# Patient Record
Sex: Female | Born: 1940 | Race: White | Hispanic: No | Smoking: Never smoker
Health system: Southern US, Community
[De-identification: ages and names within clinical notes are randomized; demographics above are authoritative.]

## PROBLEM LIST (undated history)

## (undated) DIAGNOSIS — I749 Embolism and thrombosis of unspecified artery: Secondary | ICD-10-CM

## (undated) DIAGNOSIS — I10 Essential (primary) hypertension: Secondary | ICD-10-CM

## (undated) DIAGNOSIS — R230 Cyanosis: Secondary | ICD-10-CM

## (undated) DIAGNOSIS — M79675 Pain in left toe(s): Secondary | ICD-10-CM

## (undated) HISTORY — DX: Pain in left toe(s): M79.675

## (undated) HISTORY — DX: Embolism and thrombosis of unspecified artery: I74.9

## (undated) HISTORY — DX: Essential (primary) hypertension: I10

## (undated) HISTORY — DX: Cyanosis: R23.0

## (undated) SURGERY — Surgical Case
Anesthesia: *Unknown

---

## 1998-03-02 ENCOUNTER — Other Ambulatory Visit: Admission: RE | Admit: 1998-03-02 | Discharge: 1998-03-02 | Payer: Self-pay | Admitting: Family Medicine

## 1998-07-03 ENCOUNTER — Encounter: Payer: Self-pay | Admitting: Urology

## 1998-07-07 ENCOUNTER — Ambulatory Visit (HOSPITAL_COMMUNITY): Admission: RE | Admit: 1998-07-07 | Discharge: 1998-07-07 | Payer: Self-pay | Admitting: Urology

## 1999-04-19 ENCOUNTER — Other Ambulatory Visit: Admission: RE | Admit: 1999-04-19 | Discharge: 1999-04-19 | Payer: Self-pay | Admitting: Family Medicine

## 2019-12-27 DIAGNOSIS — I10 Essential (primary) hypertension: Secondary | ICD-10-CM | POA: Diagnosis not present

## 2020-01-01 DIAGNOSIS — Z299 Encounter for prophylactic measures, unspecified: Secondary | ICD-10-CM | POA: Diagnosis not present

## 2020-01-01 DIAGNOSIS — I10 Essential (primary) hypertension: Secondary | ICD-10-CM | POA: Diagnosis not present

## 2020-01-01 DIAGNOSIS — F322 Major depressive disorder, single episode, severe without psychotic features: Secondary | ICD-10-CM | POA: Diagnosis not present

## 2020-01-26 DIAGNOSIS — I10 Essential (primary) hypertension: Secondary | ICD-10-CM | POA: Diagnosis not present

## 2020-02-26 DIAGNOSIS — I10 Essential (primary) hypertension: Secondary | ICD-10-CM | POA: Diagnosis not present

## 2020-03-27 DIAGNOSIS — I10 Essential (primary) hypertension: Secondary | ICD-10-CM | POA: Diagnosis not present

## 2020-04-28 DIAGNOSIS — I10 Essential (primary) hypertension: Secondary | ICD-10-CM | POA: Diagnosis not present

## 2020-05-28 DIAGNOSIS — I10 Essential (primary) hypertension: Secondary | ICD-10-CM | POA: Diagnosis not present

## 2020-06-27 DIAGNOSIS — I10 Essential (primary) hypertension: Secondary | ICD-10-CM | POA: Diagnosis not present

## 2020-07-28 DIAGNOSIS — I10 Essential (primary) hypertension: Secondary | ICD-10-CM | POA: Diagnosis not present

## 2020-08-27 DIAGNOSIS — I10 Essential (primary) hypertension: Secondary | ICD-10-CM | POA: Diagnosis not present

## 2020-09-28 DIAGNOSIS — I10 Essential (primary) hypertension: Secondary | ICD-10-CM | POA: Diagnosis not present

## 2020-10-26 DIAGNOSIS — I10 Essential (primary) hypertension: Secondary | ICD-10-CM | POA: Diagnosis not present

## 2020-11-17 DIAGNOSIS — B351 Tinea unguium: Secondary | ICD-10-CM | POA: Diagnosis not present

## 2020-11-26 DIAGNOSIS — I10 Essential (primary) hypertension: Secondary | ICD-10-CM | POA: Diagnosis not present

## 2020-12-25 DIAGNOSIS — I10 Essential (primary) hypertension: Secondary | ICD-10-CM | POA: Diagnosis not present

## 2021-01-05 ENCOUNTER — Other Ambulatory Visit: Payer: Self-pay

## 2021-01-05 DIAGNOSIS — M79675 Pain in left toe(s): Secondary | ICD-10-CM | POA: Diagnosis not present

## 2021-01-05 DIAGNOSIS — I739 Peripheral vascular disease, unspecified: Secondary | ICD-10-CM

## 2021-01-05 DIAGNOSIS — I749 Embolism and thrombosis of unspecified artery: Secondary | ICD-10-CM | POA: Diagnosis not present

## 2021-01-11 ENCOUNTER — Ambulatory Visit (INDEPENDENT_AMBULATORY_CARE_PROVIDER_SITE_OTHER): Payer: Medicare PPO | Admitting: Surgery

## 2021-01-11 ENCOUNTER — Other Ambulatory Visit: Payer: Self-pay

## 2021-01-11 ENCOUNTER — Ambulatory Visit (HOSPITAL_COMMUNITY)
Admission: RE | Admit: 2021-01-11 | Discharge: 2021-01-11 | Disposition: A | Discharge status: Home / Self Care | Payer: Medicare PPO | Source: Ambulatory Visit | Attending: Surgery | Admitting: Surgery

## 2021-01-11 ENCOUNTER — Encounter: Payer: Self-pay | Admitting: *Deleted

## 2021-01-11 ENCOUNTER — Encounter: Payer: Medicare PPO | Admitting: Surgery

## 2021-01-11 ENCOUNTER — Other Ambulatory Visit: Payer: Self-pay | Admitting: *Deleted

## 2021-01-11 ENCOUNTER — Encounter: Payer: Self-pay | Admitting: Surgery

## 2021-01-11 ENCOUNTER — Encounter (HOSPITAL_COMMUNITY): Payer: Medicare PPO

## 2021-01-11 VITALS — BP 184/83 | HR 54 | Temp 97.9°F | Resp 20 | Ht 65.0 in | Wt 160.0 lb

## 2021-01-11 DIAGNOSIS — I75022 Atheroembolism of left lower extremity: Secondary | ICD-10-CM | POA: Diagnosis not present

## 2021-01-11 DIAGNOSIS — I739 Peripheral vascular disease, unspecified: Secondary | ICD-10-CM | POA: Insufficient documentation

## 2021-01-11 NOTE — H&P (View-Only) (Signed)
Vascular and Vein Specialist of [facility]  Patient name: Dawn Spence MRN: [id number] DOB: 06-Sep-1940 Sex: female   REQUESTING PROVIDER:    Adam Phenix    REASON FOR CONSULT:    Blue toe  HISTORY OF PRESENT ILLNESS:   Dawn Spence is a 80 y.o. female, who is referred for evaluation of a left blue toe.  The patient states that this is been going on for approximately 6 months.  She has lost most of the nail to that toe.  Prior to this, she was able to walk around the block and up hills without difficulty however now she has to stop because of cramping.  She does not have any open wounds.  She is starting to wake up in the middle the night with toe pain.  There is some discoloration on the toes to the right foot but it is not as bad.  The patient was recently started on aspirin.  She is not on cholesterol medication.  She is medically managed for hypertension.  She is a non-smoker.  She has a family history of peripheral vascular disease with her father having had bilateral amputations.  PAST MEDICAL HISTORY    Past Medical History:  Diagnosis Date  . Arterial embolism (HCC)   . Blue toes   . Great toe pain, left    Pain wakes her up at night.  . Hypertension   . Microembolization (HCC)      FAMILY HISTORY   History reviewed. No pertinent family history.  SOCIAL HISTORY:   Social History   Socioeconomic History  . Marital status: Widowed    Spouse name: Not on file  . Number of children: Not on file  . Years of education: Not on file  . Highest education level: Not on file  Occupational History  . Not on file  Tobacco Use  . Smoking status: Never Smoker  . Smokeless tobacco: Never Used  Vaping Use  . Vaping Use: Never used  Substance and Sexual Activity  . Alcohol use: Not Currently  . Drug use: Never  . Sexual activity: Not on file  Other Topics Concern  . Not on file  Social History Narrative  . Not on file   Social  Determinants of Health   Financial Resource Strain: Not on file  Food Insecurity: Not on file  Transportation Needs: Not on file  Physical Activity: Not on file  Stress: Not on file  Social Connections: Not on file  Intimate Partner Violence: Not on file    ALLERGIES:    Allergies  Allergen Reactions  . Sulfa Antibiotics     CURRENT MEDICATIONS:    Current Outpatient Medications  Medication Sig Dispense Refill  . aspirin 81 MG chewable tablet Chew by mouth daily.    . ciclopirox (PENLAC) 8 % solution Apply topically daily. Apply to Big toenails once daily, once weekly cleanse nails thoroughly with nail polish remover.    Marland Kitchen losartan (COZAAR) 25 MG tablet Take 25 mg by mouth daily.    . metoprolol succinate (TOPROL-XL) 100 MG 24 hr tablet Take 100 mg by mouth daily. Take with or immediately following a meal.    . VENLAFAXINE HCL PO Take by mouth.    Marland Kitchen HYDROcodone-acetaminophen (NORCO/VICODIN) 5-325 MG tablet Take 1 tablet by mouth every 6 (six) hours as needed for moderate pain. (Patient not taking: Reported on 01/11/2021)     No current facility-administered medications for this visit.    REVIEW OF SYSTEMS:   [  X] denotes positive finding, [ ]  denotes negative finding Cardiac  Comments:  Chest pain or chest pressure:    Shortness of breath upon exertion:    Short of breath when lying flat:    Irregular heart rhythm:        Vascular    Pain in calf, thigh, or hip brought on by ambulation: x   Pain in feet at night that wakes you up from your sleep:  x   Blood clot in your veins:    Leg swelling:         Pulmonary    Oxygen at home:    Productive cough:     Wheezing:         Neurologic    Sudden weakness in arms or legs:     Sudden numbness in arms or legs:     Sudden onset of difficulty speaking or slurred speech:    Temporary loss of vision in one eye:     Problems with dizziness:         Gastrointestinal    Blood in stool:      Vomited blood:          Genitourinary    Burning when urinating:     Blood in urine:        Psychiatric    Major depression:         Hematologic    Bleeding problems:    Problems with blood clotting too easily:        Skin    Rashes or ulcers:        Constitutional    Fever or chills:     PHYSICAL EXAM:   Vitals:   01/11/21 1345  BP: (!) 184/83  Pulse: (!) 54  Resp: 20  Temp: 97.9 F (36.6 C)  SpO2: 96%  Weight: 160 lb (72.6 kg)  Height: 5\' 5"  (1.651 m)    GENERAL: The patient is a well-nourished female, in no acute distress. The vital signs are documented above. CARDIAC: There is a regular rate and rhythm.  VASCULAR: Nonpalpable pedal pulses PULMONARY: Nonlabored respirations MUSCULOSKELETAL: There are no major deformities or cyanosis. NEUROLOGIC: No focal weakness or paresthesias are detected. SKIN: Blue discoloration of left great toe PSYCHIATRIC: The patient has a normal affect.  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  Rather than undergoing full embolic work-up, I think we need to focus on her lower extremity vasculature.  I will start with aortogram and bilateral runoff via a right femoral approach.  If she has lesions amenable to percutaneous revascularization I will proceed at that time.  If not, we will consider surgical revascularization at a later date.  The patient  will need to be started on a statin following the procedure.  We discussed that even with revascularization, she may require to amputation for pain control.  I have for this scheduled for Tuesday, May 24.   , IV, MD,  FACS [facility]ists of [facility] [phone] Pager [phone]

## 2021-01-11 NOTE — Progress Notes (Signed)
 Vascular and Vein Specialist of [place]  Patient name: Dawn Spence MRN: [id number] DOB: 07/02/1941 Sex: female   REQUESTING PROVIDER:    Cody Drake    REASON FOR CONSULT:    Blue toe  HISTORY OF PRESENT ILLNESS:   Dawn Spence is a 80 y.o. female, who is referred for evaluation of a left blue toe.  The patient states that this is been going on for approximately 6 months.  She has lost most of the nail to that toe.  Prior to this, she was able to walk around the block and up hills without difficulty however now she has to stop because of cramping.  She does not have any open wounds.  She is starting to wake up in the middle the night with toe pain.  There is some discoloration on the toes to the right foot but it is not as bad.  The patient was recently started on aspirin.  She is not on cholesterol medication.  She is medically managed for hypertension.  She is a non-smoker.  She has a family history of peripheral vascular disease with her father having had bilateral amputations.  PAST MEDICAL HISTORY    Past Medical History:  Diagnosis Date  . Arterial embolism (HCC)   . Blue toes   . Great toe pain, left    Pain wakes her up at night.  . Hypertension   . Microembolization (HCC)      FAMILY HISTORY   History reviewed. No pertinent family history.  SOCIAL HISTORY:   Social History   Socioeconomic History  . Marital status: Widowed    Spouse name: Not on file  . Number of children: Not on file  . Years of education: Not on file  . Highest education level: Not on file  Occupational History  . Not on file  Tobacco Use  . Smoking status: Never Smoker  . Smokeless tobacco: Never Used  Vaping Use  . Vaping Use: Never used  Substance and Sexual Activity  . Alcohol use: Not Currently  . Drug use: Never  . Sexual activity: Not on file  Other Topics Concern  . Not on file  Social History Narrative  . Not on file   Social  Determinants of Health   Financial Resource Strain: Not on file  Food Insecurity: Not on file  Transportation Needs: Not on file  Physical Activity: Not on file  Stress: Not on file  Social Connections: Not on file  Intimate Partner Violence: Not on file    ALLERGIES:    Allergies  Allergen Reactions  . Sulfa Antibiotics     CURRENT MEDICATIONS:    Current Outpatient Medications  Medication Sig Dispense Refill  . aspirin 81 MG chewable tablet Chew by mouth daily.    . ciclopirox (PENLAC) 8 % solution Apply topically daily. Apply to Big toenails once daily, once weekly cleanse nails thoroughly with nail polish remover.    . losartan (COZAAR) 25 MG tablet Take 25 mg by mouth daily.    . metoprolol succinate (TOPROL-XL) 100 MG 24 hr tablet Take 100 mg by mouth daily. Take with or immediately following a meal.    . VENLAFAXINE HCL PO Take by mouth.    . HYDROcodone-acetaminophen (NORCO/VICODIN) 5-325 MG tablet Take 1 tablet by mouth every 6 (six) hours as needed for moderate pain. (Patient not taking: Reported on 01/11/2021)     No current facility-administered medications for this visit.    REVIEW OF SYSTEMS:   [  X] denotes positive finding, [ ]  denotes negative finding Cardiac  Comments:  Chest pain or chest pressure:    Shortness of breath upon exertion:    Short of breath when lying flat:    Irregular heart rhythm:        Vascular    Pain in calf, thigh, or hip brought on by ambulation: x   Pain in feet at night that wakes you up from your sleep:  x   Blood clot in your veins:    Leg swelling:         Pulmonary    Oxygen at home:    Productive cough:     Wheezing:         Neurologic    Sudden weakness in arms or legs:     Sudden numbness in arms or legs:     Sudden onset of difficulty speaking or slurred speech:    Temporary loss of vision in one eye:     Problems with dizziness:         Gastrointestinal    Blood in stool:      Vomited blood:          Genitourinary    Burning when urinating:     Blood in urine:        Psychiatric    Major depression:         Hematologic    Bleeding problems:    Problems with blood clotting too easily:        Skin    Rashes or ulcers:        Constitutional    Fever or chills:     PHYSICAL EXAM:   Vitals:   01/11/21 1345  BP: (!) 184/83  Pulse: (!) 54  Resp: 20  Temp: 97.9 F (36.6 C)  SpO2: 96%  Weight: 160 lb (72.6 kg)  Height: 5\' 5"  (1.651 m)    GENERAL: The patient is a well-nourished female, in no acute distress. The vital signs are documented above. CARDIAC: There is a regular rate and rhythm.  VASCULAR: Nonpalpable pedal pulses PULMONARY: Nonlabored respirations MUSCULOSKELETAL: There are no major deformities or cyanosis. NEUROLOGIC: No focal weakness or paresthesias are detected. SKIN: Blue discoloration of left great toe PSYCHIATRIC: The patient has a normal affect.  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  Rather than undergoing full embolic work-up, I think we need to focus on her lower extremity vasculature.  I will start with aortogram and bilateral runoff via a right femoral approach.  If she has lesions amenable to percutaneous revascularization I will proceed at that time.  If not, we will consider surgical revascularization at a later date.  The patient  will need to be started on a statin following the procedure.  We discussed that even with revascularization, she may require to amputation for pain control.  I have for this scheduled for Tuesday, May 24.   , IV, MD,  FACS [facility] of [facility] [phone] Pager [phone]

## 2021-01-12 ENCOUNTER — Telehealth: Payer: Self-pay

## 2021-01-12 NOTE — Telephone Encounter (Signed)
Spoke to pt's daughter in law regarding statin not being ordered yet. Per MD note from yesterday office visit, pt is to start statin after her procedure next week. She verbalized understanding and will call back if they have any further questions/concerns.

## 2021-01-19 ENCOUNTER — Other Ambulatory Visit: Payer: Self-pay

## 2021-01-19 ENCOUNTER — Ambulatory Visit (HOSPITAL_COMMUNITY)
Admission: RE | Admit: 2021-01-19 | Discharge: 2021-01-19 | Disposition: A | Discharge status: Home / Self Care | Payer: Medicare PPO | Attending: Surgery | Admitting: Surgery

## 2021-01-19 ENCOUNTER — Encounter (HOSPITAL_COMMUNITY)
Admission: RE | Disposition: A | Discharge status: Home / Self Care | Payer: Self-pay | Source: Home / Self Care | Attending: Surgery

## 2021-01-19 DIAGNOSIS — Z7982 Long term (current) use of aspirin: Secondary | ICD-10-CM | POA: Diagnosis not present

## 2021-01-19 DIAGNOSIS — Z79899 Other long term (current) drug therapy: Secondary | ICD-10-CM | POA: Diagnosis not present

## 2021-01-19 DIAGNOSIS — I75022 Atheroembolism of left lower extremity: Secondary | ICD-10-CM | POA: Diagnosis not present

## 2021-01-19 DIAGNOSIS — Z8249 Family history of ischemic heart disease and other diseases of the circulatory system: Secondary | ICD-10-CM | POA: Diagnosis not present

## 2021-01-19 DIAGNOSIS — Z882 Allergy status to sulfonamides status: Secondary | ICD-10-CM | POA: Diagnosis not present

## 2021-01-19 DIAGNOSIS — I739 Peripheral vascular disease, unspecified: Secondary | ICD-10-CM | POA: Diagnosis not present

## 2021-01-19 DIAGNOSIS — I10 Essential (primary) hypertension: Secondary | ICD-10-CM | POA: Insufficient documentation

## 2021-01-19 HISTORY — PX: ABDOMINAL AORTOGRAM W/LOWER EXTREMITY: CATH118223

## 2021-01-19 LAB — POCT I-STAT, CHEM 8
BUN: 19 mg/dL (ref 8–23)
Calcium, Ion: 1.22 mmol/L (ref 1.15–1.40)
Chloride: 100 mmol/L (ref 98–111)
Creatinine, Ser: 1.3 mg/dL — ABNORMAL HIGH (ref 0.44–1.00)
Glucose, Bld: 111 mg/dL — ABNORMAL HIGH (ref 70–99)
HCT: 40 % (ref 36.0–46.0)
Hemoglobin: 13.6 g/dL (ref 12.0–15.0)
Potassium: 3.5 mmol/L (ref 3.5–5.1)
Sodium: 139 mmol/L (ref 135–145)
TCO2: 28 mmol/L (ref 22–32)

## 2021-01-19 SURGERY — ABDOMINAL AORTOGRAM W/LOWER EXTREMITY
Anesthesia: LOCAL | Laterality: Bilateral

## 2021-01-19 MED ORDER — HEPARIN (PORCINE) IN NACL 1000-0.9 UT/500ML-% IV SOLN
INTRAVENOUS | Status: DC | PRN
  Administered 2021-01-19: 500 mL

## 2021-01-19 MED ORDER — SODIUM CHLORIDE 0.9 % IV SOLN: INTRAVENOUS | Status: DC

## 2021-01-19 MED ORDER — FENTANYL CITRATE (PF) 100 MCG/2ML IJ SOLN
INTRAMUSCULAR | Status: AC
  Filled 2021-01-19: qty 2

## 2021-01-19 MED ORDER — LIDOCAINE HCL (PF) 1 % IJ SOLN
INTRAMUSCULAR | Status: AC
  Filled 2021-01-19: qty 30

## 2021-01-19 MED ORDER — SODIUM CHLORIDE 0.9 % WEIGHT BASED INFUSION: 1.0000 mL/kg/h | INTRAVENOUS | Status: DC

## 2021-01-19 MED ORDER — HYDRALAZINE HCL 20 MG/ML IJ SOLN: 5.0000 mg | INTRAMUSCULAR | Status: DC | PRN

## 2021-01-19 MED ORDER — MIDAZOLAM HCL 2 MG/2ML IJ SOLN
INTRAMUSCULAR | Status: AC
  Filled 2021-01-19: qty 2

## 2021-01-19 MED ORDER — SODIUM CHLORIDE 0.9% FLUSH: 3.0000 mL | INTRAVENOUS | Status: DC | PRN

## 2021-01-19 MED ORDER — IODIXANOL 320 MG/ML IV SOLN
INTRAVENOUS | Status: DC | PRN
  Administered 2021-01-19: 112 mL

## 2021-01-19 MED ORDER — ONDANSETRON HCL 4 MG/2ML IJ SOLN: 4.0000 mg | Freq: Four times a day (QID) | INTRAMUSCULAR | Status: DC | PRN

## 2021-01-19 MED ORDER — ACETAMINOPHEN 325 MG PO TABS: 650.0000 mg | ORAL_TABLET | ORAL | Status: DC | PRN

## 2021-01-19 MED ORDER — SODIUM CHLORIDE 0.9% FLUSH: 3.0000 mL | Freq: Two times a day (BID) | INTRAVENOUS | Status: DC

## 2021-01-19 MED ORDER — MIDAZOLAM HCL 2 MG/2ML IJ SOLN
INTRAMUSCULAR | Status: DC | PRN
  Administered 2021-01-19: 1 mg via INTRAVENOUS
  Administered 2021-01-19: 2 mg via INTRAVENOUS

## 2021-01-19 MED ORDER — ASPIRIN EC 81 MG PO TBEC: 81.0000 mg | DELAYED_RELEASE_TABLET | Freq: Every day | ORAL | Status: DC

## 2021-01-19 MED ORDER — SODIUM CHLORIDE 0.9 % IV SOLN: 250.0000 mL | INTRAVENOUS | Status: DC | PRN

## 2021-01-19 MED ORDER — LABETALOL HCL 5 MG/ML IV SOLN
10.0000 mg | INTRAVENOUS | Status: DC | PRN
  Administered 2021-01-19: 10 mg via INTRAVENOUS
  Filled 2021-01-19: qty 4

## 2021-01-19 MED ORDER — FENTANYL CITRATE (PF) 100 MCG/2ML IJ SOLN
INTRAMUSCULAR | Status: DC | PRN
  Administered 2021-01-19: 25 ug via INTRAVENOUS
  Administered 2021-01-19: 50 ug via INTRAVENOUS

## 2021-01-19 MED ORDER — HEPARIN (PORCINE) IN NACL 1000-0.9 UT/500ML-% IV SOLN
INTRAVENOUS | Status: AC
  Filled 2021-01-19: qty 1000

## 2021-01-19 MED ORDER — LIDOCAINE HCL (PF) 1 % IJ SOLN
INTRAMUSCULAR | Status: DC | PRN
  Administered 2021-01-19: 15 mL

## 2021-01-19 SURGICAL SUPPLY — 10 items
CATH OMNI FLUSH 5F 65CM (CATHETERS) ×1 IMPLANT
DEVICE VASC CLSR CELT ART 5 (Vascular Products) ×1 IMPLANT
GUIDEWIRE ANGLED .035X150CM (WIRE) ×1 IMPLANT
KIT MICROPUNCTURE NIT STIFF (SHEATH) ×1 IMPLANT
KIT PV (KITS) ×2 IMPLANT
SHEATH PINNACLE 5F 10CM (SHEATH) ×1 IMPLANT
SYR MEDRAD MARK V 150ML (SYRINGE) ×1 IMPLANT
TRANSDUCER W/STOPCOCK (MISCELLANEOUS) ×2 IMPLANT
TRAY PV CATH (CUSTOM PROCEDURE TRAY) ×2 IMPLANT
WIRE HITORQ VERSACORE ST 145CM (WIRE) ×1 IMPLANT

## 2021-01-19 NOTE — Discharge Instructions (Signed)
Femoral Site Care  This sheet gives you information about how to care for yourself after your procedure. Your health care provider may also give you more specific instructions. If you have problems or questions, contact your health care provider. What can I expect after the procedure? After the procedure, it is common to have:  Bruising that usually fades within 1-2 weeks.  Tenderness at the site. Follow these instructions at home: Wound care  Follow instructions from your health care provider about how to take care of your insertion site. Make sure you: ? Wash your hands with soap and water before you change your bandage (dressing). If soap and water are not available, use hand sanitizer. ? Change your dressing as told by your health care provider. ? Leave stitches (sutures), skin glue, or adhesive strips in place. These skin closures may need to stay in place for 2 weeks or longer. If adhesive strip edges start to loosen and curl up, you may trim the loose edges. Do not remove adhesive strips completely unless your health care provider tells you to do that.  Do not take baths, swim, or use a hot tub until your health care provider approves.  You may shower 24-48 hours after the procedure or as told by your health care provider. ? Gently wash the site with plain soap and water. ? Pat the area dry with a clean towel. ? Do not rub the site. This may cause bleeding.  Do not apply powder or lotion to the site. Keep the site clean and dry.  Check your femoral site every day for signs of infection. Check for: ? Redness, swelling, or pain. ? Fluid or blood. ? Warmth. ? Pus or a bad smell. Activity  For the first 2-3 days after your procedure, or as long as directed: ? Avoid climbing stairs as much as possible. ? Do not squat.  Do not lift anything that is heavier than 10 lb (4.5 kg), or the limit that you are told, until your health care provider says that it is safe.  Rest as  directed. ? Avoid sitting for a long time without moving. Get up to take short walks every 1-2 hours.  Do not drive for 24 hours if you were given a medicine to help you relax (sedative). General instructions  Take over-the-counter and prescription medicines only as told by your health care provider.  Keep all follow-up visits as told by your health care provider. This is important. Contact a health care provider if you have:  A fever or chills.  You have redness, swelling, or pain around your insertion site. Get help right away if:  The catheter insertion area swells very fast.  You pass out.  You suddenly start to sweat or your skin gets clammy.  The catheter insertion area is bleeding, and the bleeding does not stop when you hold steady pressure on the area.  The area near or just beyond the catheter insertion site becomes pale, cool, tingly, or numb. These symptoms may represent a serious problem that is an emergency. Do not wait to see if the symptoms will go away. Get medical help right away. Call your local emergency services (911 in the [place] Do not drive yourself to the hospital. Summary  After the procedure, it is common to have bruising that usually fades within 1-2 weeks.  Check your femoral site every day for signs of infection.  Do not lift anything that is heavier than 10 lb (4.5 kg), or   the limit that you are told, until your health care provider says that it is safe. This information is not intended to replace advice given to you by your health care provider. Make sure you discuss any questions you have with your health care provider. Document Revised: 04/17/2020 Document Reviewed: 04/17/2020 Elsevier Patient Education  2021 Elsevier Inc[employer]  

## 2021-01-19 NOTE — Interval H&P Note (Signed)
History and Physical Interval Note:  01/19/2021 2:03 PM  Dawn Spence  has presented today for surgery, with the diagnosis of pad.  The various methods of treatment have been discussed with the patient and family. After consideration of risks, benefits and other options for treatment, the patient has consented to  Procedure(s): ABDOMINAL AORTOGRAM W/LOWER EXTREMITY (N/A) as a surgical intervention.  The patient's history has been reviewed, patient examined, no change in status, stable for surgery.  I have reviewed the patient's chart and labs.  Questions were answered to the patient's satisfaction.     [name]

## 2021-01-19 NOTE — Op Note (Signed)
    Patient name: Dawn Spence MRN: [id number] DOB: 1941-04-25 Sex: female  01/19/2021 Pre-operative Diagnosis: Left blue toe Post-operative diagnosis:  Same Surgeon:  [name] Procedure Performed:  1.  Ultrasound-guided access, right femoral artery  2.  Abdominal aortogram  3.  Bilateral lower extremity runoff  4.  Second-order catheterization  5.  Conscious sedation, 39 minutes  6.  Closure device, Celt     Indications: This is a 80 year old female with a left gluteal.  She comes in for further evaluation and possible intervention.  Procedure:  The patient was identified in the holding area and taken to room 8.  The patient was then placed supine on the table and prepped and draped in the usual sterile fashion.  A time out was called.  Conscious sedation was administered with the use of IV fentanyl and Versed under continuous physician and nurse monitoring.  Heart rate, blood pressure, and oxygen saturation were continuously monitored.  Total sedation time was 39 minutes.  Ultrasound was used to evaluate the right common femoral artery.  It was patent .  A digital ultrasound image was acquired.  A micropuncture needle was used to access the right common femoral artery under ultrasound guidance.  An 018 wire was advanced without resistance and a micropuncture sheath was placed.  The 018 wire was removed and a benson wire was placed.  The micropuncture sheath was exchanged for a 5 french sheath.  An omniflush catheter was advanced over the wire to the level of L-1.  An abdominal angiogram was obtained.  Next, using the omniflush catheter and a benson wire, the aortic bifurcation was crossed and the catheter was placed into theleft external iliac artery and left runoff was obtained.  right runoff was performed via retrograde sheath injections.  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  Approximately 40% left renal artery stenosis.  The infrarenal abdominal  aorta is calcified but patent without significant stenosis.  Bilateral common and external iliac arteries are patent without significant stenosis.  Right Lower Extremity: The right common femoral and profundofemoral artery are widely patent.  The superficial femoral artery is patent throughout its course.  There is approximately 250% tandem lesions within the adductor canal.  The popliteal artery is widely patent.  There is diffuse disease with near total occlusion in the right anterior tibial artery which is the dominant runoff to the ankle however it does appear to occlude at the ankle.  There is reconstitution of the posterior tibial artery and peroneal artery down by the ankle.  Left Lower Extremity: The left common femoral and profundofemoral artery are widely patent.  The superficial femoral artery is diffusely diseased with multiple areas of greater than 60% stenosis.  There is occlusion of the left superficial femoral artery at the adductor canal.  There is reconstitution of an above-knee popliteal island which then occludes.  There is reconstitution of the peroneal artery which is the single-vessel runoff.  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 [name], M.D., [facility] Specialists of [place] Office: [phone] Pager:  [phone]

## 2021-01-20 ENCOUNTER — Encounter (HOSPITAL_COMMUNITY): Payer: Self-pay | Admitting: Surgery

## 2021-01-21 ENCOUNTER — Encounter: Payer: Self-pay | Admitting: Cardiovascular Disease

## 2021-01-21 ENCOUNTER — Telehealth: Payer: Self-pay

## 2021-01-21 ENCOUNTER — Other Ambulatory Visit: Payer: Self-pay

## 2021-01-21 ENCOUNTER — Ambulatory Visit (HOSPITAL_COMMUNITY): Payer: Medicare PPO | Attending: Surgery

## 2021-01-21 ENCOUNTER — Ambulatory Visit: Payer: Medicare PPO | Admitting: Cardiovascular Disease

## 2021-01-21 ENCOUNTER — Other Ambulatory Visit: Payer: Self-pay | Admitting: *Deleted

## 2021-01-21 VITALS — BP 134/84 | HR 56 | Ht 65.0 in | Wt 161.0 lb

## 2021-01-21 DIAGNOSIS — I75022 Atheroembolism of left lower extremity: Secondary | ICD-10-CM | POA: Insufficient documentation

## 2021-01-21 DIAGNOSIS — I749 Embolism and thrombosis of unspecified artery: Secondary | ICD-10-CM

## 2021-01-21 DIAGNOSIS — I10 Essential (primary) hypertension: Secondary | ICD-10-CM | POA: Diagnosis not present

## 2021-01-21 DIAGNOSIS — I739 Peripheral vascular disease, unspecified: Secondary | ICD-10-CM | POA: Insufficient documentation

## 2021-01-21 DIAGNOSIS — Z01810 Encounter for preprocedural cardiovascular examination: Secondary | ICD-10-CM | POA: Insufficient documentation

## 2021-01-21 LAB — ECHOCARDIOGRAM COMPLETE
Area-P 1/2: 3.65 cm2
Height: 65 in
P 1/2 time: 720 msec
S' Lateral: 2 cm
Weight: 2576 oz

## 2021-01-21 NOTE — Progress Notes (Signed)
Based on the angiographic appearance of the blood vessels and both legs, this appears to be an embolic event likely from a central source.  Therefore the patient will need to have additional imaging to look for the offending lesion.  This will include a CT angiogram of the chest abdomen and pelvis, an echocardiogram, and a hypercoagulable blood panel.  She is also going to need surgical revascularization of her left leg, given the appearance of her toe.  I will try to expedite this in an attempt to avoid amputation.  [name]

## 2021-01-21 NOTE — Patient Instructions (Signed)
Medication Instructions:  Your provider recommends that you continue on your current medications as directed. Please refer to the Current Medication list given to you today.   *If you need a refill on your cardiac medications before your next appointment, please call your pharmacy*  Lab Work: TODAY! Lipid, liver If you have labs (blood work) drawn today and your tests are completely normal, you will receive your results only by: Marland Kitchen MyChart Message (if you have MyChart) OR . A paper copy in the mail If you have any lab test that is abnormal or we need to change your treatment, we will call you to review the results.  Testing/Procedures: Your provider has requested that you have an echocardiogram. Echocardiography is a painless test that uses sound waves to create images of your heart. It provides your doctor with information about the size and shape of your heart and how well your heart's chambers and valves are working. This procedure takes approximately one hour. There are no restrictions for this procedure.  Follow-Up: Dr. Excell Seltzer recommends that you schedule a follow-up appointment AS NEEDED!  You are cleared for surgery.

## 2021-01-21 NOTE — Progress Notes (Signed)
Cardiology Office Note:    Date:  01/21/2021   ID:  Dawn Spence, DOB 19-Oct-1940, MRN [id number]  PCP:  Ignatius Specking, MD   [facility] Providers Cardiologist:  None     Referring MD: Ignatius Specking, MD   Chief Complaint  Patient presents with  . Pre-op Exam    History of Present Illness:    Dawn Spence is a 80 y.o. female with a hx of peripheral arterial disease, presenting for preoperative cardiovascular evaluation.  The patient was recently evaluated by Dr. Myra Gianotti because of a blue toe.  She has developed progressive intermittent claudication of the left calf and pain in the left great toe.  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  Femoral-popliteal bypass has been recommended.  She presents today for preoperative cardiac evaluation.  The patient is here with her son and daughter-in-law today. She has no personal history of cardiac disease. Over the past 6 months she has been limited by left calf claudication. She is only able to walk one lap around the track and she's forced to stop because of leg pain. Pain resolves with rest. She denies CP, dyspnea, chest pressure, lightheadedness, or heart palpitations. She has previously been very healthy with no functional limitation.   Past Medical History:  Diagnosis Date  . Arterial embolism (HCC)   . Blue toes   . Great toe pain, left    Pain wakes her up at night.  . Hypertension   . Microembolization [facility])     Past Surgical History:  Procedure Laterality Date  . ABDOMINAL AORTOGRAM W/LOWER EXTREMITY Bilateral 01/19/2021   Procedure: ABDOMINAL AORTOGRAM W/LOWER EXTREMITY;  Surgeon: Nada Libman, MD;  Location: [facility] INV[facility]IVE CV LAB;  Service: Cardiovascular;  Laterality: Bilateral;    Current Medications: Current Meds  Medication Sig  . acetaminophen (TYLENOL) 500 MG tablet Take 500 mg by mouth every 6 (six) hours as needed for moderate pain.  Marland Kitchen amLODipine (NORVASC) 10  MG tablet Take 10 mg by mouth daily.  Marland Kitchen Apoaequorin (PREVAGEN) 10 MG CAPS Take 10 mg by mouth daily.  . candesartan-hydrochlorothiazide (ATACAND HCT) 32-12.5 MG tablet Take 1 tablet by mouth daily.  . metoprolol tartrate (LOPRESSOR) 25 MG tablet Take 25-50 mg by mouth See admin instructions. Take 50 mg in the evening and 25 mg in the evening  . venlafaxine XR (EFFEXOR-XR) 75 MG 24 hr capsule Take 75 mg by mouth daily with breakfast.     Allergies:   Sulfa antibiotics   Social History   Socioeconomic History  . Marital status: Widowed    Spouse name: Not on file  . Number of children: 1  . Years of education: Not on file  . Highest education level: Not on file  Occupational History  . Not on file  Tobacco Use  . Smoking status: Never Smoker  . Smokeless tobacco: Never Used  Vaping Use  . Vaping Use: Never used  Substance and Sexual Activity  . Alcohol use: Not Currently  . Drug use: Never  . Sexual activity: Not on file  Other Topics Concern  . Not on file  Social History Narrative  . Not on file   Social Determinants of Health   Financial Resource Strain: Not on file  Food Insecurity: Not on file  Transportation Needs: Not on file  Physical Activity: Not on file  Stress: Not on file  Social Connections: Not on file     Family  History: The patient's family history includes CAD in her brother. Father had PAD with bilateral amputation.   ROS:   Please see the history of present illness.    All other systems reviewed and are negative.  EKGs/Labs/Other Studies Reviewed:    The following studies were reviewed today: none  EKG:  EKG is ordered today.  The ekg ordered today demonstrates sinus bradycardia 56 bpm, otherwise normal.   Recent Labs: 01/19/2021: BUN 19; Creatinine, Ser 1.30; Hemoglobin 13.6; Potassium 3.5; Sodium 139  Recent Lipid Panel No results found for: CHOL, TRIG, HDL, CHOLHDL, VLDL, LDLCALC, LDLDIRECT   Risk Assessment/Calculations:        Physical Exam:    VS:  BP 134/84   Pulse (!) 56   Ht 5\' 5"  (1.651 m)   Wt 161 lb (73 kg)   SpO2 98%   BMI 26.79 kg/m     Wt Readings from Last 3 Encounters:  01/21/21 161 lb (73 kg)  01/19/21 155 lb (70.3 kg)  01/11/21 160 lb (72.6 kg)     GEN:  Well nourished, well developed in no acute distress HEENT: Normal NECK: No JVD; No carotid bruits LYMPHATICS: No lymphadenopathy CARDIAC: RRR, no murmurs, rubs, gallops RESPIRATORY:  Clear to auscultation without rales, wheezing or rhonchi  ABDOMEN: Soft, non-tender, non-distended MUSCULOSKELETAL:  No edema; No deformity.  The right great toe is discolored with bluish/purple coloration SKIN: Warm and dry NEUROLOGIC:  Alert and oriented x 3 PSYCHIATRIC:  Normal affect   ASSESSMENT:    1. Preop cardiovascular exam   2. Intermittent claudication (HCC)   3. Arterial embolism (HCC)    PLAN:    In order of problems listed above:  1. The patient has no cardiovascular symptoms at a good workload, clearly greater than 4 metabolic equivalents.  She has a normal EKG.  I do not think she needs further cardiac evaluation to clear for surgery.  She may proceed at low risk of cardiac complications. 2. Per Dr. 01/13/21, his notes reviewed. 3. Appears to be localized embolization from plaque more proximal in the left leg.  For completeness, will obtain a 2D echocardiogram to rule out any potential cardiac source.  Considering her diagnosis of peripheral arterial disease, she should be on a statin drug.  We will check lipids and LFTs today and likely initiate rosuvastatin 20 mg.  I will touch base with them after her lipids and LFTs result.  Disposition: Patient okay to proceed with surgery at low risk of cardiac complication.  Follow-up cholesterol results and initiate statin.  Patient okay to follow with her primary physician and I will be happy to see her back as needed.   Medication Adjustments/Labs and Tests Ordered: Current medicines  are reviewed at length with the patient today.  Concerns regarding medicines are outlined above.  Orders Placed This Encounter  Procedures  . Lipid panel  . Hepatic function panel  . EKG 12-Lead   No orders of the defined types were placed in this encounter.   Patient Instructions  Medication Instructions:  Your provider recommends that you continue on your current medications as directed. Please refer to the Current Medication list given to you today.   *If you need a refill on your cardiac medications before your next appointment, please call your pharmacy*  Lab Work: TODAY! Lipid, liver If you have labs (blood work) drawn today and your tests are completely normal, you will receive your results only by: Myra Gianotti MyChart Message (if you have MyChart) OR .  A paper copy in the mail If you have any lab test that is abnormal or we need to change your treatment, we will call you to review the results.  Testing/Procedures: Your provider has requested that you have an echocardiogram. Echocardiography is a painless test that uses sound waves to create images of your heart. It provides your doctor with information about the size and shape of your heart and how well your heart's chambers and valves are working. This procedure takes approximately one hour. There are no restrictions for this procedure.  Follow-Up: Dr. Excell Seltzer recommends that you schedule a follow-up appointment AS NEEDED!  You are cleared for surgery.    Signed, Tonny Bollman, MD  01/21/2021 5:04 PM    [facility]

## 2021-01-21 NOTE — Telephone Encounter (Signed)
Spoke with patient daughter in law, Rosey Bath, as she and husband are brining patient to appointments. Made her aware of move in location for the appointment so possible ECHO may happen at same location today. She verbalized understanding and was appreciative for the adjustment. No additional questions at this time.

## 2021-01-21 NOTE — Telephone Encounter (Signed)
Called patient to adjust appointment from [facility] office to [facility].  Patient stated she doe not handle her appointments her son, Karen Kitchens, does and I need to call him. I reached out and left message on his home machine and his cell.  Dr. Excell Seltzer is able to see patient today at [facility] at 1:20pm if they are able to come.

## 2021-01-22 LAB — LIPID PANEL
Chol/HDL Ratio: 5.2 ratio — ABNORMAL HIGH (ref 0.0–4.4)
Cholesterol, Total: 261 mg/dL — ABNORMAL HIGH (ref 100–199)
HDL: 50 mg/dL (ref >39)
LDL Chol Calc (NIH): 169 mg/dL — ABNORMAL HIGH (ref 0–99)
Triglycerides: 223 mg/dL — ABNORMAL HIGH (ref 0–149)
VLDL Cholesterol Cal: 42 mg/dL — ABNORMAL HIGH (ref 5–40)

## 2021-01-22 LAB — HEPATIC FUNCTION PANEL
ALT: 21 IU/L (ref 0–32)
AST: 20 IU/L (ref 0–40)
Albumin: 4.4 g/dL (ref 3.7–4.7)
Alkaline Phosphatase: 105 IU/L (ref 44–121)
Bilirubin Total: 0.5 mg/dL (ref 0.0–1.2)
Bilirubin, Direct: 0.17 mg/dL (ref 0.00–0.40)
Total Protein: 7.2 g/dL (ref 6.0–8.5)

## 2021-01-26 ENCOUNTER — Other Ambulatory Visit (HOSPITAL_COMMUNITY): Payer: Medicare PPO

## 2021-01-26 DIAGNOSIS — I10 Essential (primary) hypertension: Secondary | ICD-10-CM | POA: Diagnosis not present

## 2021-01-27 ENCOUNTER — Other Ambulatory Visit: Payer: Self-pay

## 2021-01-27 DIAGNOSIS — I350 Nonrheumatic aortic (valve) stenosis: Secondary | ICD-10-CM

## 2021-01-27 DIAGNOSIS — I75022 Atheroembolism of left lower extremity: Secondary | ICD-10-CM

## 2021-01-27 DIAGNOSIS — I739 Peripheral vascular disease, unspecified: Secondary | ICD-10-CM

## 2021-01-29 ENCOUNTER — Telehealth: Payer: Self-pay

## 2021-01-29 ENCOUNTER — Other Ambulatory Visit: Payer: Self-pay | Admitting: *Deleted

## 2021-01-29 DIAGNOSIS — I6529 Occlusion and stenosis of unspecified carotid artery: Secondary | ICD-10-CM

## 2021-01-29 DIAGNOSIS — I75022 Atheroembolism of left lower extremity: Secondary | ICD-10-CM

## 2021-01-29 DIAGNOSIS — E7800 Pure hypercholesterolemia, unspecified: Secondary | ICD-10-CM

## 2021-01-29 DIAGNOSIS — I739 Peripheral vascular disease, unspecified: Secondary | ICD-10-CM

## 2021-01-29 MED ORDER — ROSUVASTATIN CALCIUM 20 MG PO TABS: 20.0000 mg | ORAL_TABLET | Freq: Every day | ORAL | 3 refills | Status: DC

## 2021-01-29 NOTE — Telephone Encounter (Signed)
Reviewed results and instructions with patient who verbalized understanding.   She requested her son, Dawn Spence, was called to review before medications called in. Reviewed with Dawn Spence.  She will START CRESTOR 20 mg daily. FLP and LFTs scheduled 04/29/21. They were grateful for call and agree with plan.

## 2021-01-29 NOTE — Telephone Encounter (Signed)
-----   Message from Tonny Bollman, MD sent at 01/24/2021 12:14 PM EDT ----- Please start rosuvastatin 20 mg daily with follow-up labs in 3 months

## 2021-02-03 ENCOUNTER — Ambulatory Visit (HOSPITAL_COMMUNITY): Payer: Medicare PPO

## 2021-02-03 ENCOUNTER — Ambulatory Visit (HOSPITAL_COMMUNITY)
Admission: RE | Admit: 2021-02-03 | Discharge: 2021-02-03 | Disposition: A | Discharge status: Home / Self Care | Payer: Medicare PPO | Source: Ambulatory Visit | Attending: Surgery | Admitting: Surgery

## 2021-02-03 ENCOUNTER — Encounter (HOSPITAL_COMMUNITY): Payer: Self-pay

## 2021-02-03 ENCOUNTER — Other Ambulatory Visit: Payer: Self-pay

## 2021-02-03 DIAGNOSIS — I350 Nonrheumatic aortic (valve) stenosis: Secondary | ICD-10-CM | POA: Insufficient documentation

## 2021-02-03 DIAGNOSIS — I75022 Atheroembolism of left lower extremity: Secondary | ICD-10-CM | POA: Diagnosis not present

## 2021-02-03 DIAGNOSIS — I701 Atherosclerosis of renal artery: Secondary | ICD-10-CM | POA: Diagnosis not present

## 2021-02-03 DIAGNOSIS — K8080 Other cholelithiasis without obstruction: Secondary | ICD-10-CM | POA: Diagnosis not present

## 2021-02-03 DIAGNOSIS — I739 Peripheral vascular disease, unspecified: Secondary | ICD-10-CM | POA: Diagnosis not present

## 2021-02-03 DIAGNOSIS — I2510 Atherosclerotic heart disease of native coronary artery without angina pectoris: Secondary | ICD-10-CM | POA: Diagnosis not present

## 2021-02-03 DIAGNOSIS — D259 Leiomyoma of uterus, unspecified: Secondary | ICD-10-CM | POA: Diagnosis not present

## 2021-02-03 MED ORDER — IOHEXOL 350 MG/ML SOLN
100.0000 mL | Freq: Once | INTRAVENOUS | Status: AC | PRN
  Administered 2021-02-03: 100 mL via INTRAVENOUS

## 2021-02-03 MED ORDER — SODIUM CHLORIDE (PF) 0.9 % IJ SOLN
INTRAMUSCULAR | Status: AC
  Filled 2021-02-03: qty 50

## 2021-02-05 ENCOUNTER — Ambulatory Visit (HOSPITAL_COMMUNITY)
Admission: RE | Admit: 2021-02-05 | Discharge: 2021-02-05 | Disposition: A | Discharge status: Home / Self Care | Payer: Medicare PPO | Source: Ambulatory Visit | Attending: Surgery | Admitting: Surgery

## 2021-02-05 ENCOUNTER — Ambulatory Visit (INDEPENDENT_AMBULATORY_CARE_PROVIDER_SITE_OTHER)
Admission: RE | Admit: 2021-02-05 | Discharge: 2021-02-05 | Disposition: A | Discharge status: Home / Self Care | Payer: Medicare PPO | Source: Ambulatory Visit | Attending: Surgery | Admitting: Surgery

## 2021-02-05 ENCOUNTER — Other Ambulatory Visit: Payer: Self-pay

## 2021-02-05 DIAGNOSIS — I75022 Atheroembolism of left lower extremity: Secondary | ICD-10-CM | POA: Diagnosis not present

## 2021-02-05 DIAGNOSIS — I6529 Occlusion and stenosis of unspecified carotid artery: Secondary | ICD-10-CM | POA: Insufficient documentation

## 2021-02-05 DIAGNOSIS — I739 Peripheral vascular disease, unspecified: Secondary | ICD-10-CM | POA: Insufficient documentation

## 2021-02-08 ENCOUNTER — Ambulatory Visit (INDEPENDENT_AMBULATORY_CARE_PROVIDER_SITE_OTHER): Payer: Medicare PPO | Admitting: Surgery

## 2021-02-08 ENCOUNTER — Other Ambulatory Visit: Payer: Self-pay

## 2021-02-08 ENCOUNTER — Encounter: Payer: Self-pay | Admitting: Surgery

## 2021-02-08 VITALS — BP 126/74 | HR 61 | Temp 98.2°F | Resp 16 | Ht 65.0 in | Wt 160.0 lb

## 2021-02-08 DIAGNOSIS — I75022 Atheroembolism of left lower extremity: Secondary | ICD-10-CM | POA: Diagnosis not present

## 2021-02-08 MED ORDER — NITROGLYCERIN 0.1 MG/HR TD PT24: 0.2000 mg | MEDICATED_PATCH | Freq: Every day | TRANSDERMAL | Status: AC

## 2021-02-08 NOTE — Progress Notes (Signed)
[facility]ist of [facility]  Patient name: Dawn Spence MRN: [id number] DOB: [birth year] Sex: female   REASON FOR VISIT:    Follow up  HISOTRY OF PRESENT ILLNESS:    Dawn Spence is a 80 y.o. female who presented with an May 2022 for evaluation of the left blue toe which has been present for 6 months.  This was waking her up at night secondary to pain.  There was also some discoloration in the right foot but was not as noticeable.  Patient underwent angiography that showed diffuse atherosclerotic disease, not amenable to percutaneous intervention.  This was on both legs I was concerned about the possibility of a thromboembolic event and so I sent her for CT scan and back today for discussions regarding possible surgery.   The patient was recently started on aspirin.  She is not on cholesterol medication.  She is medically managed for hypertension.  She is a non-smoker.  She has a family history of peripheral vascular disease with her father having had bilateral amputations. PAST MEDICAL HISTORY:   Past Medical History:  Diagnosis Date   Arterial embolism (HCC)    Blue toes    Great toe pain, left    Pain wakes her up at night.   Hypertension    Microembolization (HCC)      FAMILY HISTORY:   Family History  Problem Relation Age of Onset   CAD Brother     SOCIAL HISTORY:   Social History   Tobacco Use   Smoking status: Never   Smokeless tobacco: Never  Substance Use Topics   Alcohol use: Not Currently     ALLERGIES:   Allergies  Allergen Reactions   Sulfa Antibiotics     Unknown reaction     CURRENT MEDICATIONS:   Current Outpatient Medications  Medication Sig Dispense Refill   acetaminophen (TYLENOL) 500 MG tablet Take 500 mg by mouth every 6 (six) hours as needed for moderate pain.     amLODipine (NORVASC) 10 MG tablet Take 10 mg by mouth daily.     Apoaequorin (PREVAGEN) 10 MG CAPS Take 10 mg by  mouth daily.     candesartan-hydrochlorothiazide (ATACAND HCT) 32-12.5 MG tablet Take 1 tablet by mouth daily.     metoprolol tartrate (LOPRESSOR) 25 MG tablet Take 25-50 mg by mouth See admin instructions. Take 50 mg in the evening and 25 mg in the evening     rosuvastatin (CRESTOR) 20 MG tablet Take 1 tablet (20 mg total) by mouth daily. 90 tablet 3   venlafaxine XR (EFFEXOR-XR) 75 MG 24 hr capsule Take 75 mg by mouth daily with breakfast.     No current facility-administered medications for this visit.    REVIEW OF SYSTEMS:   [X]  denotes positive finding, [ ]  denotes negative finding Cardiac  Comments:  Chest pain or chest pressure:    Shortness of breath upon exertion:    Short of breath when lying flat:    Irregular heart rhythm:        Vascular    Pain in calf, thigh, or hip brought on by ambulation: x   Pain in feet at night that wakes you up from your sleep:     Blood clot in your veins:    Leg swelling:         Pulmonary    Oxygen at home:    Productive cough:     Wheezing:         Neurologic  Sudden weakness in arms or legs:     Sudden numbness in arms or legs:     Sudden onset of difficulty speaking or slurred speech:    Temporary loss of vision in one eye:     Problems with dizziness:         Gastrointestinal    Blood in stool:     Vomited blood:         Genitourinary    Burning when urinating:     Blood in urine:        Psychiatric    Major depression:         Hematologic    Bleeding problems:    Problems with blood clotting too easily:        Skin    Rashes or ulcers:        Constitutional    Fever or chills:      PHYSICAL EXAM:   Vitals:   02/08/21 1336  BP: 126/74  Pulse: 61  Resp: 16  Temp: 98.2 F (36.8 C)  SpO2: 96%  Weight: 160 lb (72.6 kg)  Height: 5\' 5"  (1.651 m)    GENERAL: The patient is a well-nourished female, in no acute distress. The vital signs are documented above. CARDIAC: There is a regular rate and rhythm.   VASCULAR: No significant changes the appearance of her left great toe PULMONARY: Non-labored respirations MUSCULOSKELETAL: There are no major deformities or cyanosis. NEUROLOGIC: No focal weakness or paresthesias are detected. SKIN: There are no ulcers or rashes noted. PSYCHIATRIC: The patient has a normal affect.  STUDIES:   I have reviewed the following studies: CTA: The CT angiogram of chest/abdomen/pelvis negative for ulcerated plaque, pedunculated thrombus, left ventricular thrombus, or left atrial thrombus. There are 3 focal regions of slightly irregular soft plaque of the thoracic aorta, potentially contributing to the patient's presentation. Aortic Atherosclerosis (ICD10-I70.0).   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   Borderline enlarged lymph node in the ileocolic mesentery, most likely reactive node/inflammatory. Although there are no overt CT signs of colon malignancy, it may be useful to confirm that the patient is up-to-date with screening studies and consider GI referral for colonoscopy.   Coronary artery disease.  Carotid duplex:  Right Carotid: Velocities in the right ICA are consistent with a 1-39%  stenosis.   Left Carotid: Velocities in the left ICA are consistent with a 1-39%  stenosis.                The ECA appears >50% stenosed.   Vertebrals:  Bilateral vertebral arteries demonstrate antegrade flow.  Subclavians: Normal flow hemodynamics were seen in bilateral subclavian               arteries.   Vein mapping:   +---------------+-----------+----------------------+---------------+-------  ----+    RT Diameter  RT Findings         GSV            LT Diameter  LT  Findings       (cm)                                            (cm)                    +---------------+-----------+----------------------+---------------+-------  ----+       0.42  Saphenofemoral          0.41                                                     Junction                                    +---------------+-----------+----------------------+---------------+-------  ----+       0.25                     Proximal thigh         0.27                    +---------------+-----------+----------------------+---------------+-------  ----+       0.26                       Mid thigh            0.15                    +---------------+-----------+----------------------+---------------+-------  ----+       0.20                      Distal thigh          0.13                    +---------------+-----------+----------------------+---------------+-------  ----+       0.26                          Knee              0.24                    +---------------+-----------+----------------------+---------------+-------  ----+       0.16                       Prox calf            0.27                    +---------------+-----------+----------------------+---------------+-------  ----+       0.16                        Mid calf            0.16                    +---------------+-----------+----------------------+---------------+-------  ----+       0.21                      Distal calf                                   +---------------+-----------+----------------------+---------------+-------  ----+   +----------------+-----------+---------------+----------------+-----------+   RT diameter (cm)RT Findings      SSV      LT Diameter (cm)LT  Findings  +----------------+-----------+---------------+----------------+-----------+         0.26  Popliteal fossa      0.34                    +----------------+-----------+---------------+----------------+-----------+         0.21                  Proximal calf       0.31                     +----------------+-----------+---------------+----------------+-----------+         0.20                    Mid calf          0.27                    +----------------+-----------+---------------+----------------+-----------+   MEDICAL ISSUES:   Left blue toe: Now after reviewing her CT scan and embolic work-up, I feel that her bleed secondary to advanced atherosclerotic disease.  There are 2 small lesions in her thoracic aorta which I do not think explain the findings on angiography.  Unfortunately, the patient has limited options for revascularization.  Her vein is too small for bypass.  I think her options are percutaneous attempt at recanalization of her occluded popliteal and tibioperoneal trunk versus femoral peroneal bypass graft with Gore-Tex.  I discussed limited durability of both of these options.  The patient has been having issues for approximately 8 months and so we will continue to try and manage her nonoperatively.  I will give her a nitroglycerin patch to place on her foot to see if this helps improve her microcirculation.  She knows to monitor her foot and contact me if she develops a problem, which could easily turn into a limb threatening issue.  I have her scheduled for follow-up in 3 months for repeat evaluation.  I spent greater than 40 minutes discussing the case with the family and reviewing the images.    Charlena Cross, MD, FACS [facility] [phone] Pager [phone]

## 2021-02-11 NOTE — Telephone Encounter (Signed)
Dawn Bollman, MD  Henrietta Dine, RN Echo looks good with normal LV function and no significant valvularabnormalities.  Okay to proceed with surgery as outlined in recent office note.        Results previously reviewed with patient and clearance sent to Dr. Myra Gianotti prior to appointment.

## 2021-02-15 ENCOUNTER — Encounter (HOSPITAL_COMMUNITY): Payer: Self-pay | Admitting: Surgery

## 2021-02-25 DIAGNOSIS — I10 Essential (primary) hypertension: Secondary | ICD-10-CM | POA: Diagnosis not present

## 2021-04-01 DIAGNOSIS — D692 Other nonthrombocytopenic purpura: Secondary | ICD-10-CM | POA: Diagnosis not present

## 2021-04-01 DIAGNOSIS — R5383 Other fatigue: Secondary | ICD-10-CM | POA: Diagnosis not present

## 2021-04-01 DIAGNOSIS — Z6828 Body mass index (BMI) 28.0-28.9, adult: Secondary | ICD-10-CM | POA: Diagnosis not present

## 2021-04-01 DIAGNOSIS — Z1331 Encounter for screening for depression: Secondary | ICD-10-CM | POA: Diagnosis not present

## 2021-04-01 DIAGNOSIS — E559 Vitamin D deficiency, unspecified: Secondary | ICD-10-CM | POA: Diagnosis not present

## 2021-04-01 DIAGNOSIS — Z79899 Other long term (current) drug therapy: Secondary | ICD-10-CM | POA: Diagnosis not present

## 2021-04-01 DIAGNOSIS — Z0000 Encounter for general adult medical examination without abnormal findings: Secondary | ICD-10-CM | POA: Diagnosis not present

## 2021-04-01 DIAGNOSIS — Z7189 Other specified counseling: Secondary | ICD-10-CM | POA: Diagnosis not present

## 2021-04-01 DIAGNOSIS — I10 Essential (primary) hypertension: Secondary | ICD-10-CM | POA: Diagnosis not present

## 2021-04-01 DIAGNOSIS — E7800 Pure hypercholesterolemia, unspecified: Secondary | ICD-10-CM | POA: Diagnosis not present

## 2021-04-01 DIAGNOSIS — F3342 Major depressive disorder, recurrent, in full remission: Secondary | ICD-10-CM | POA: Diagnosis not present

## 2021-04-01 DIAGNOSIS — Z1339 Encounter for screening examination for other mental health and behavioral disorders: Secondary | ICD-10-CM | POA: Diagnosis not present

## 2021-04-28 DIAGNOSIS — I10 Essential (primary) hypertension: Secondary | ICD-10-CM | POA: Diagnosis not present

## 2021-04-29 ENCOUNTER — Other Ambulatory Visit: Payer: Medicare PPO

## 2021-04-29 ENCOUNTER — Telehealth: Payer: Self-pay

## 2021-04-29 ENCOUNTER — Other Ambulatory Visit: Payer: Self-pay

## 2021-04-29 DIAGNOSIS — E7800 Pure hypercholesterolemia, unspecified: Secondary | ICD-10-CM | POA: Diagnosis not present

## 2021-04-29 DIAGNOSIS — I739 Peripheral vascular disease, unspecified: Secondary | ICD-10-CM | POA: Diagnosis not present

## 2021-04-29 DIAGNOSIS — D692 Other nonthrombocytopenic purpura: Secondary | ICD-10-CM | POA: Diagnosis not present

## 2021-04-29 DIAGNOSIS — I10 Essential (primary) hypertension: Secondary | ICD-10-CM | POA: Diagnosis not present

## 2021-04-29 DIAGNOSIS — Z299 Encounter for prophylactic measures, unspecified: Secondary | ICD-10-CM | POA: Diagnosis not present

## 2021-04-29 DIAGNOSIS — T148XXA Other injury of unspecified body region, initial encounter: Secondary | ICD-10-CM | POA: Diagnosis not present

## 2021-04-29 LAB — LIPID PANEL
Chol/HDL Ratio: 3 ratio (ref 0.0–4.4)
Cholesterol, Total: 141 mg/dL (ref 100–199)
HDL: 47 mg/dL (ref >39)
LDL Chol Calc (NIH): 57 mg/dL (ref 0–99)
Triglycerides: 231 mg/dL — ABNORMAL HIGH (ref 0–149)
VLDL Cholesterol Cal: 37 mg/dL (ref 5–40)

## 2021-04-29 LAB — HEPATIC FUNCTION PANEL
ALT: 19 IU/L (ref 0–32)
AST: 22 IU/L (ref 0–40)
Albumin: 5 g/dL — ABNORMAL HIGH (ref 3.7–4.7)
Alkaline Phosphatase: 97 IU/L (ref 44–121)
Bilirubin Total: 0.5 mg/dL (ref 0.0–1.2)
Bilirubin, Direct: 0.17 mg/dL (ref 0.00–0.40)
Total Protein: 6.6 g/dL (ref 6.0–8.5)

## 2021-04-29 NOTE — Telephone Encounter (Addendum)
Check-in called triage nurse to come see patient about her falling in the parking lot. Dawn Spence out to lobby talked to patient and her daughter Dawn Spence. Patient stated she fell about 8:45 am today on the side walk right outside of the front entrance. Patient stated she caught herself by landing on her knees and hands. Patient stated she just fell, she could not explain what happened to make her fall. Patient has bandage on left palm that she reports has some bleeding, no visual blood seen. Patient's right wrist is swollen and bruised. Swollen knot on wrist is about the size of a marble. Patient reports that she can move her wrist. Patient stated her knees are sore. Patient has jeans on, so no visual sign of damage. Patient did report that the only blood thinner she is on is aspirin 81 mg. Patient denies any dizziness or any heart issues. Patient was coming into office to check her cholesterol. Instructed patient to go to urgent care to get her wrist checked out and have someone to assess her.

## 2021-04-29 NOTE — Telephone Encounter (Signed)
thx

## 2021-04-30 NOTE — Telephone Encounter (Signed)
Called patient to see how she was doing from her fall yesterday.  She states that her daughter in law thinks she did not get her foot up on the curb far enough when stepping up from parking lot and it tripped her up.  She says she is able to move her hand and wrist and does not feel she needs to have it xrayed.  Says just bruised and will take a few days to be back to normal.  She thanked me for calling and checking on her.

## 2021-05-10 ENCOUNTER — Other Ambulatory Visit: Payer: Self-pay

## 2021-05-10 ENCOUNTER — Ambulatory Visit (INDEPENDENT_AMBULATORY_CARE_PROVIDER_SITE_OTHER): Payer: Medicare PPO | Admitting: Physician Assistant

## 2021-05-10 VITALS — BP 158/74 | HR 54 | Temp 97.5°F | Ht 65.0 in | Wt 162.4 lb

## 2021-05-10 DIAGNOSIS — I75022 Atheroembolism of left lower extremity: Secondary | ICD-10-CM

## 2021-05-10 NOTE — Progress Notes (Signed)
Office Note     CC:  follow up Requesting Provider:  Ignatius Specking, MD  HPI: Dawn Spence is a 80 y.o. (May 01, 1941) female who presents for follow-up of left blue toe.  She was seen by Dr. Myra Gianotti in June of this year with discoloration of her left great toe which had been present for about 6 months.  She is accompanied by her son during today's visit.  Both patient and her son believe the appearance of her left great toe has greatly improved.  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  She denies significant claudication, rest pain, or other tissue changes of her left foot.  She is on a statin daily.    Past Medical History:  Diagnosis Date   Arterial embolism (HCC)    Blue toes    Great toe pain, left    Pain wakes her up at night.   Hypertension    Microembolization [facility])     Past Surgical History:  Procedure Laterality Date   ABDOMINAL AORTOGRAM W/LOWER EXTREMITY Bilateral 01/19/2021   Procedure: ABDOMINAL AORTOGRAM W/LOWER EXTREMITY;  Surgeon: Nada Libman, MD;  Location: [facility];  Service: Cardiovascular;  Laterality: Bilateral;    Social History   Socioeconomic History   Marital status: Widowed    Spouse name: Not on file   Number of children: 1   Years of education: Not on file   Highest education level: Not on file  Occupational History   Not on file  Tobacco Use   Smoking status: Never   Smokeless tobacco: Never  Vaping Use   Vaping Use: Never used  Substance and Sexual Activity   Alcohol use: Not Currently   Drug use: Never   Sexual activity: Not on file  Other Topics Concern   Not on file  Social History Narrative   Not on file   Social Determinants of Health   Financial Resource Strain: Not on file  Food Insecurity: Not on file  Transportation Needs: Not on file  Physical Activity: Not on file  Stress: Not on file  Social Connections: Not on file  Intimate Partner Violence: Not on file    Family History   Problem Relation Age of Onset   CAD Brother     Current Outpatient Medications  Medication Sig Dispense Refill   acetaminophen (TYLENOL) 500 MG tablet Take 500 mg by mouth every 6 (six) hours as needed for moderate pain.     amLODipine (NORVASC) 10 MG tablet Take 10 mg by mouth daily.     Apoaequorin (PREVAGEN) 10 MG CAPS Take 10 mg by mouth daily.     candesartan-hydrochlorothiazide (ATACAND HCT) 32-12.5 MG tablet Take 1 tablet by mouth daily.     metoprolol tartrate (LOPRESSOR) 25 MG tablet Take 25-50 mg by mouth See admin instructions. Take 50 mg in the evening and 25 mg in the evening     rosuvastatin (CRESTOR) 20 MG tablet Take 1 tablet (20 mg total) by mouth daily. 90 tablet 3   venlafaxine XR (EFFEXOR-XR) 75 MG 24 hr capsule Take 75 mg by mouth daily with breakfast.     Current Facility-Administered Medications  Medication Dose Route Frequency Provider Last Rate Last Admin   nitroGLYCERIN (NITRODUR - Dosed in mg/24 hr) patch 0.2 mg  0.2 mg Transdermal Daily Nada Libman, MD        Allergies  Allergen Reactions   Sulfa Antibiotics     Unknown reaction  REVIEW OF SYSTEMS:   [X]  denotes positive finding, [ ]  denotes negative finding Cardiac  Comments:  Chest pain or chest pressure:    Shortness of breath upon exertion:    Short of breath when lying flat:    Irregular heart rhythm:        Vascular    Pain in calf, thigh, or hip brought on by ambulation:    Pain in feet at night that wakes you up from your sleep:     Blood clot in your veins:    Leg swelling:         Pulmonary    Oxygen at home:    Productive cough:     Wheezing:         Neurologic    Sudden weakness in arms or legs:     Sudden numbness in arms or legs:     Sudden onset of difficulty speaking or slurred speech:    Temporary loss of vision in one eye:     Problems with dizziness:         Gastrointestinal    Blood in stool:     Vomited blood:         Genitourinary    Burning when  urinating:     Blood in urine:        Psychiatric    Major depression:         Hematologic    Bleeding problems:    Problems with blood clotting too easily:        Skin    Rashes or ulcers:        Constitutional    Fever or chills:      PHYSICAL EXAMINATION:  Vitals:   05/10/21 1015  BP: (!) 158/74  Pulse: (!) 54  Temp: (!) 97.5 F (36.4 C)  TempSrc: Skin  SpO2: 99%  Weight: 162 lb 6.4 oz (73.7 kg)  Height: 5\' 5"  (1.651 m)    General:  WDWN in NAD; vital signs documented above Gait: Not observed HENT: WNL, normocephalic Pulmonary: normal non-labored breathing , without Rales, rhonchi,  wheezing Cardiac: regular HR Abdomen: soft, NT, no masses Skin: without rashes Vascular Exam/Pulses:  Right Left  Radial 2+ (normal) 2+ (normal)  DP absent absent  PT absent absent   Extremities: without ischemic changes, without Gangrene , without cellulitis; without open wounds; left great toe still somewhat purplish in appearance however no areas of tissue necrosis or other wounds Musculoskeletal: no muscle wasting or atrophy  Neurologic: A&O X 3;  No focal weakness or paresthesias are detected Psychiatric:  The pt has Normal affect.   Non-Invasive Vascular Imaging:   None    ASSESSMENT/PLAN:: 80 y.o. female here for reevaluation of left great toe discoloration  -Subjectively left great toe appearance has improved drastically per patient and her son.  She denies any rest pain or other tissue changes.  She has a known occluded left popliteal and TP trunk.  Dr. plan was to consider endovascular revascularization versus femoral to peroneal bypass with Gore-Tex if the toe were to worsen.  Given that the appearance of the toe is improved we will proceed conservatively.  She will continue her statin daily.  She has been encouraged to ambulate as much as possible.  She will follow-up with ABIs in 1 year.  If she were to develop further tissue loss or rest pain she and her  son know to call and return office sooner   [phone], PA-C [facility] [phone]  Clinic MD:   Myra Gianotti

## 2021-05-28 DIAGNOSIS — I10 Essential (primary) hypertension: Secondary | ICD-10-CM | POA: Diagnosis not present

## 2021-06-28 DIAGNOSIS — I10 Essential (primary) hypertension: Secondary | ICD-10-CM | POA: Diagnosis not present

## 2021-07-19 IMAGING — CT CT CTA ABD/PEL W/CM AND/OR W/O CM
2 of 6 series · 13 of 46 positions shown, 15 images · IV contrast (OMNIPAQUE)
Comparison: None.

CLINICAL DATA: 80-year-old female with blue toe syndrome

EXAM:
CT ANGIOGRAPHY CHEST, ABDOMEN AND PELVIS
TECHNIQUE: Multidetector CT imaging through the chest, abdomen and pelvis was
performed using the standard protocol during bolus administration of
intravenous contrast. Multiplanar reconstructed images and MIPs were
obtained and reviewed to evaluate the vascular anatomy.
CONTRAST:  100mL OMNIPAQUE IOHEXOL 350 MG/ML SOLN

[Series 4: axial arterial · axial · arterial · 0.77mm/px · z∈[-341,+235]mm · 10 of 216 slices shown, 12 images]
[im 12/216  soft-tissue]
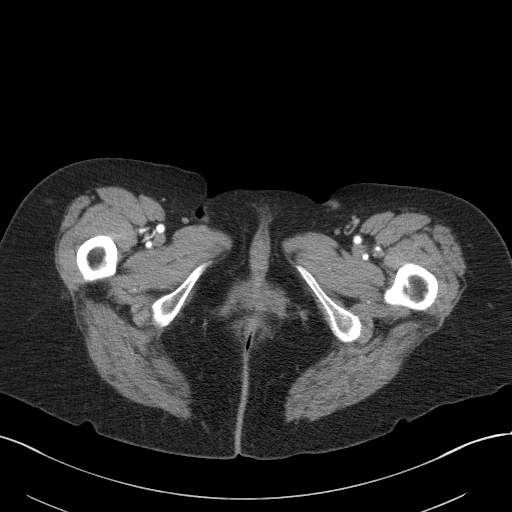
[im 12/216  bone]
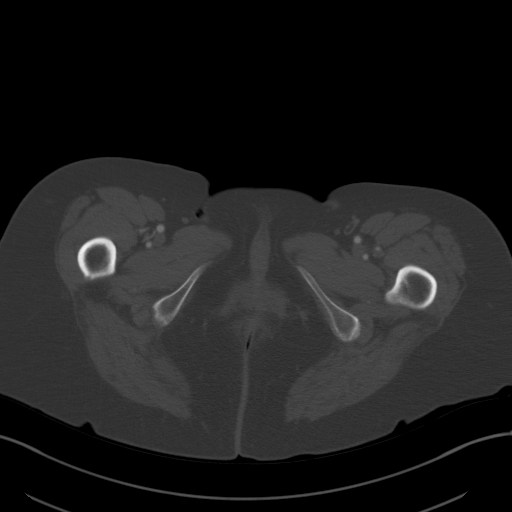
[im 36/216  soft-tissue]
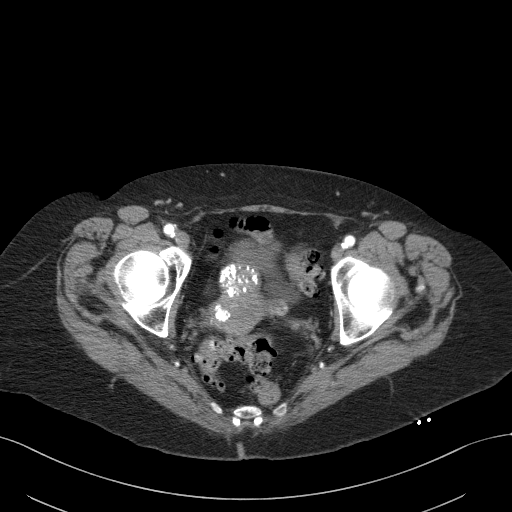
[im 60/216  soft-tissue]
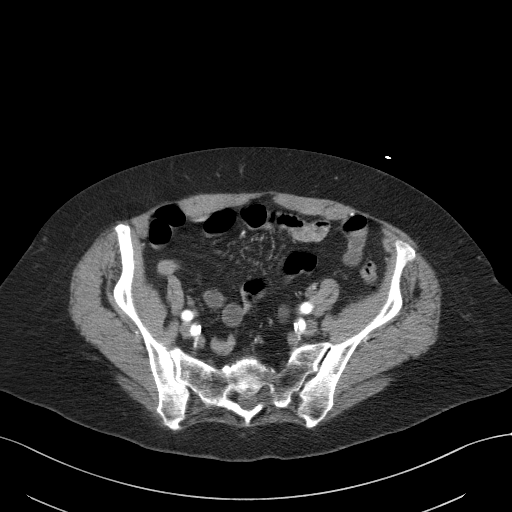
[im 72/216  soft-tissue]
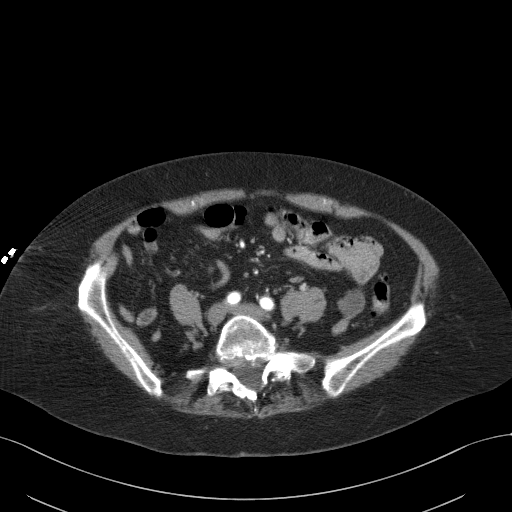
[im 96/216  soft-tissue]
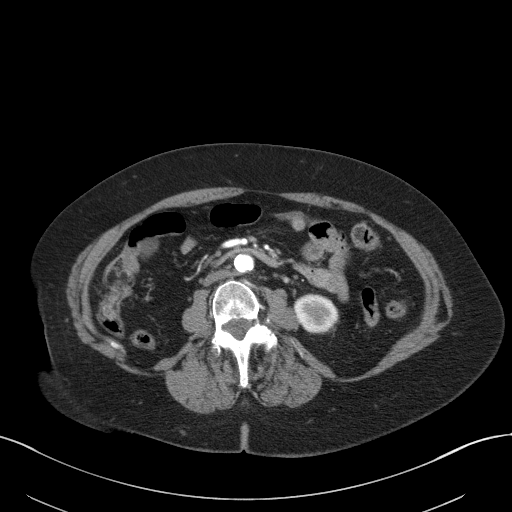
[im 120/216  soft-tissue]
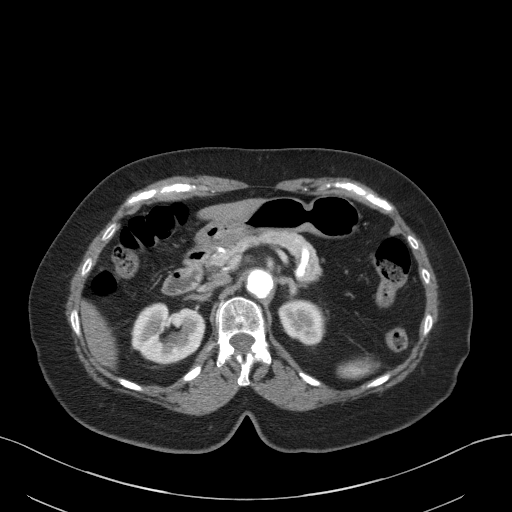
[im 144/216  soft-tissue]
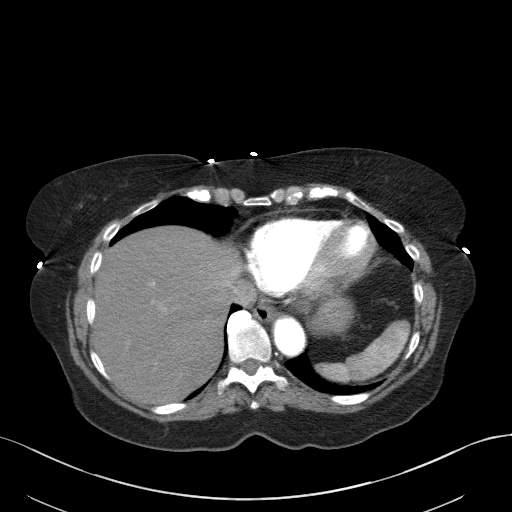
[im 156/216  soft-tissue]
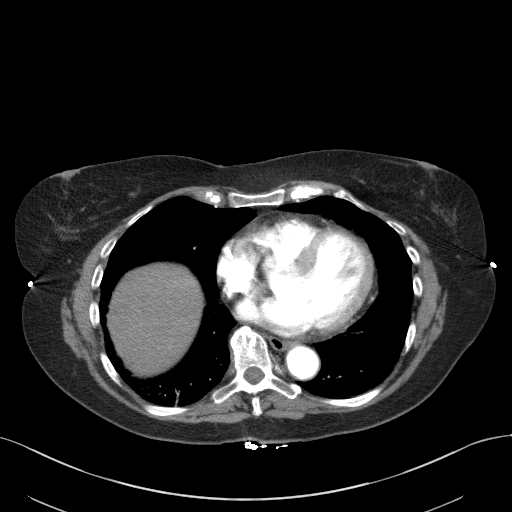
[im 180/216  soft-tissue]
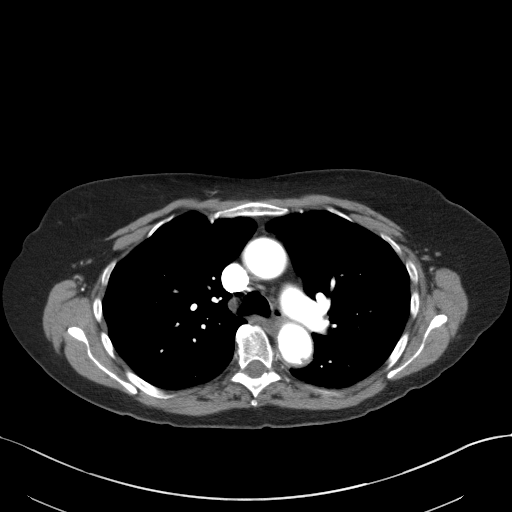
[im 180/216  bone]
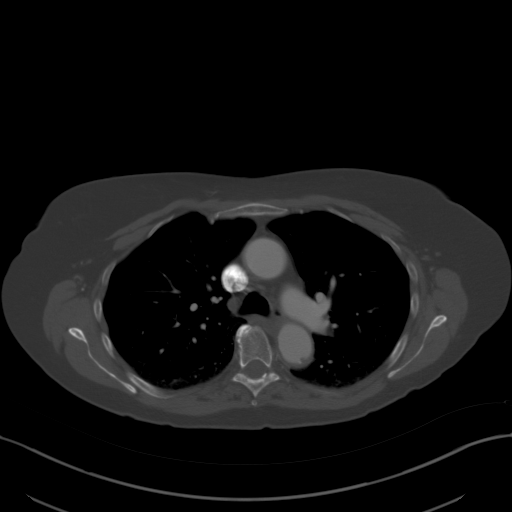
[im 204/216  soft-tissue]
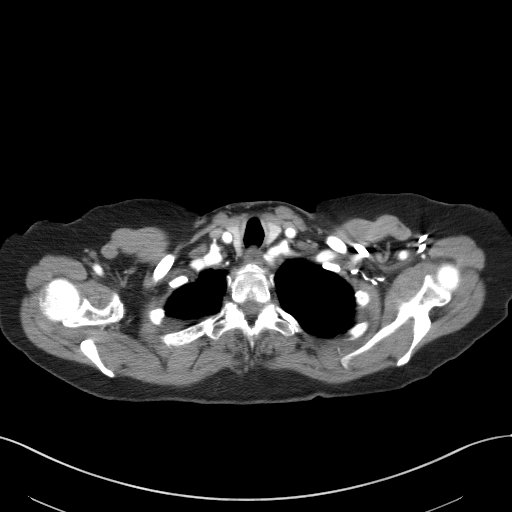

[Series 5: coronal · coronal · 1.11mm/px · 3 of 73 slices shown]
[im 19/73  soft-tissue]
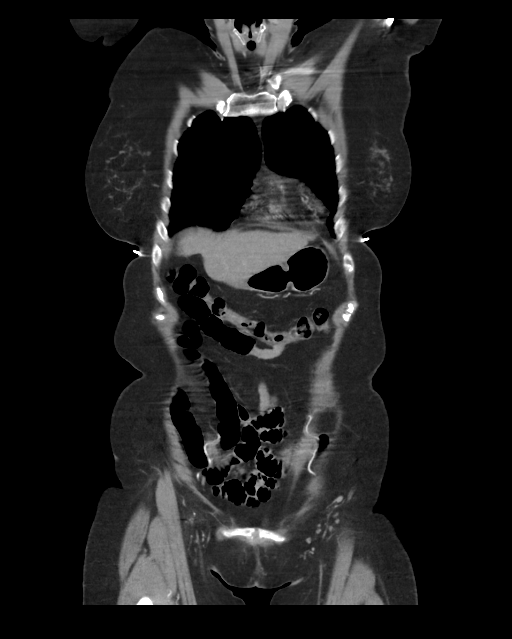
[im 37/73  soft-tissue]
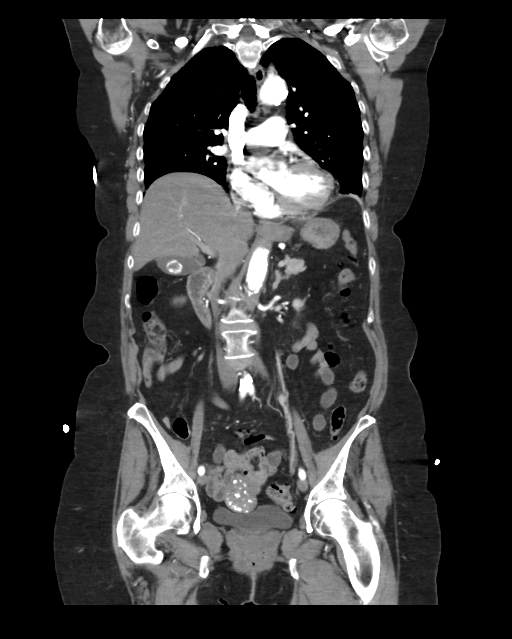
[im 55/73  soft-tissue]
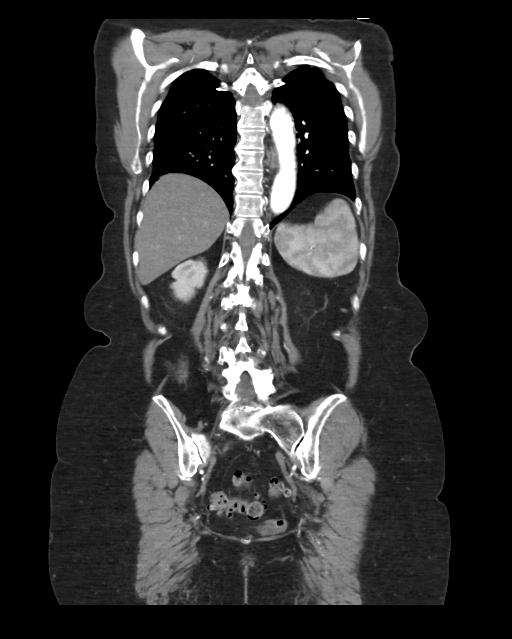

[13 of 46 positions shown; findings below may reference images not displayed]

FINDINGS: CTA CHEST FINDINGS

Cardiovascular:

Heart:

No cardiomegaly. No pericardial fluid/thickening. Calcifications of
the left anterior descending coronary artery and the right coronary
artery. No evidence filling defect within the left ventricle.

Aorta:

No significant aortic valve calcifications. Maximum estimated
diameter of the ascending aorta 3.3 cm.

Three vessel arch. No significant atherosclerotic changes at the
origin of the branch vessels.

Tortuosity of the branch vessels. Unremarkable course caliber and
contour of the bilateral subclavian artery. The cervical cerebral
arteries are patent at the base of the neck.

No dissection.

Mild calcified plaque of the aortic arch and proximal descending
thoracic aorta.

There are 3 focal regions of irregular plaque of the descending
aorta. None of these have a pedunculated appearance. Negative for
ulcerated plaque/penetrating ulcer.

Diameter of the aorta at the aortic hiatus 2.2 cm.

Pulmonary arteries:

Timing of the contrast bolus is not optimized for evaluation of
pulmonary artery filling defects. No filling defects within the left
atrium.

Mediastinum/Nodes: No mediastinal adenopathy. Unremarkable
appearance of the thoracic esophagus.

Unremarkable appearance of the thoracic inlet.

Lungs/Pleura: Central airways are clear. No pleural effusion. No
confluent airspace disease.

No pneumothorax.

CTA ABDOMEN AND PELVIS FINDINGS

VASCULAR

Aorta: Mild to moderate atherosclerotic changes of the abdominal
aorta. No ulcerated plaque, dissection, significant stenosis. No
pedunculated plaque within the abdominal aorta.

Celiac: Mild atherosclerosis at the celiac artery origin without
high-grade stenosis. Mild atherosclerotic changes of the splenic
artery with tortuosity. Ectasias of a splenic artery branch at the
splenic hilum measuring 4 mm.

SMA: Patent, with no significant atherosclerotic changes.

Renals:

- Right: At least 50% narrowing at the right renal artery origin
secondary to soft atherosclerotic plaque. No pre hilar branches.

- Left: At least 50% narrowing at the origin of the left renal
artery secondary to soft atherosclerotic plaque. No pre hilar
branches.

IMA: IMA patent with atherosclerotic changes at the origin

Right lower extremity:

Unremarkable course, caliber, and contour of the right iliac system.
No aneurysm, dissection, or occlusion. Hypogastric artery is patent.
Mild atherosclerotic changes of the right iliac system. Common
femoral artery patent. Changes of prior closure device at the common
femoral artery. Proximal SFA and profunda femoris patent.

Left lower extremity:

Unremarkable course, caliber, and contour of the left iliac system.
No aneurysm, dissection, or occlusion. Hypogastric artery is patent.
Mild atherosclerotic changes of the left iliac system. Common
femoral artery patent. Proximal SFA and profunda femoris patent.

Veins: Unremarkable appearance of the venous system.

Review of the MIP images confirms the above findings.

NON-VASCULAR

Hepatobiliary: Diffusely decreased attenuation of the liver
parenchyma compatible with steatosis. Calcified cholelithiasis
without inflammatory changes.

Pancreas: Unremarkable.

Spleen: Unremarkable.

Adrenals/Urinary Tract:

- Right adrenal gland: Unremarkable

- Left adrenal gland: Unremarkable.

- Right kidney: No hydronephrosis, nephrolithiasis, inflammation, or
ureteral dilation. No focal lesion.

- Left Kidney: No hydronephrosis, nephrolithiasis, inflammation, or
ureteral dilation. No focal lesion.

- Urinary Bladder: Small gas at the anti dependent aspects of the
urinary bladder. No inflammatory changes.

Stomach/Bowel:

- Stomach: Unremarkable.

- Small bowel: Unremarkable

- Appendix: Normal.

- Colon: Colonic diverticula. No inflammatory changes. Otherwise
unremarkable colon.

Lymphatic: Borderline enlarged lymph node/nodule within the
ileocolic mesentery measuring 7 mm-8 mm on image 124 of series 4.
There is additional small lymph node on image 134 of series 4 in the
ileocolic pathway.

Mesenteric: No free fluid or inflammatory changes.

Reproductive: Fibroid uterus.

Other: Small fat containing inguinal hernia. Incidental lipomatous
changes of the left external oblique musculature.

Musculoskeletal: Degenerative changes of the visualized spine. No
acute displaced fracture. No significant bony canal narrowing.
IMPRESSION: The CT angiogram of chest/abdomen/pelvis negative for ulcerated
plaque, pedunculated thrombus, left ventricular thrombus, or left
atrial thrombus. There are 3 focal regions of slightly irregular
soft plaque of the thoracic aorta, potentially contributing to the
patient's presentation. Aortic Atherosclerosis (YNS09-MSC.C).

Bilateral renal arterial disease, with at least 50% narrowing at the
origins bilaterally.

Borderline enlarged lymph node in the ileocolic mesentery, most
likely reactive node/inflammatory. Although there are no overt CT
signs of colon malignancy, it may be useful to confirm that the
patient is up-to-date with screening studies and consider GI
referral for colonoscopy.

Coronary artery disease.

Additional ancillary findings as above

## 2021-08-05 DIAGNOSIS — H5203 Hypermetropia, bilateral: Secondary | ICD-10-CM | POA: Diagnosis not present

## 2021-08-16 DIAGNOSIS — Z6828 Body mass index (BMI) 28.0-28.9, adult: Secondary | ICD-10-CM | POA: Diagnosis not present

## 2021-08-16 DIAGNOSIS — Z2821 Immunization not carried out because of patient refusal: Secondary | ICD-10-CM | POA: Diagnosis not present

## 2021-08-16 DIAGNOSIS — Z713 Dietary counseling and surveillance: Secondary | ICD-10-CM | POA: Diagnosis not present

## 2021-08-16 DIAGNOSIS — I10 Essential (primary) hypertension: Secondary | ICD-10-CM | POA: Diagnosis not present

## 2021-08-16 DIAGNOSIS — Z299 Encounter for prophylactic measures, unspecified: Secondary | ICD-10-CM | POA: Diagnosis not present

## 2021-09-14 DIAGNOSIS — Z6828 Body mass index (BMI) 28.0-28.9, adult: Secondary | ICD-10-CM | POA: Diagnosis not present

## 2021-09-14 DIAGNOSIS — I739 Peripheral vascular disease, unspecified: Secondary | ICD-10-CM | POA: Diagnosis not present

## 2021-09-14 DIAGNOSIS — Z789 Other specified health status: Secondary | ICD-10-CM | POA: Diagnosis not present

## 2021-09-14 DIAGNOSIS — F3342 Major depressive disorder, recurrent, in full remission: Secondary | ICD-10-CM | POA: Diagnosis not present

## 2021-09-14 DIAGNOSIS — Z299 Encounter for prophylactic measures, unspecified: Secondary | ICD-10-CM | POA: Diagnosis not present

## 2021-09-14 DIAGNOSIS — I10 Essential (primary) hypertension: Secondary | ICD-10-CM | POA: Diagnosis not present

## 2021-09-14 DIAGNOSIS — R21 Rash and other nonspecific skin eruption: Secondary | ICD-10-CM | POA: Diagnosis not present

## 2021-09-16 DIAGNOSIS — Z299 Encounter for prophylactic measures, unspecified: Secondary | ICD-10-CM | POA: Diagnosis not present

## 2021-09-16 DIAGNOSIS — B029 Zoster without complications: Secondary | ICD-10-CM | POA: Diagnosis not present

## 2021-09-16 DIAGNOSIS — R21 Rash and other nonspecific skin eruption: Secondary | ICD-10-CM | POA: Diagnosis not present

## 2021-09-16 DIAGNOSIS — Z6827 Body mass index (BMI) 27.0-27.9, adult: Secondary | ICD-10-CM | POA: Diagnosis not present

## 2021-09-16 DIAGNOSIS — R079 Chest pain, unspecified: Secondary | ICD-10-CM | POA: Diagnosis not present

## 2021-11-18 DIAGNOSIS — I10 Essential (primary) hypertension: Secondary | ICD-10-CM | POA: Diagnosis not present

## 2021-11-18 DIAGNOSIS — R001 Bradycardia, unspecified: Secondary | ICD-10-CM | POA: Diagnosis not present

## 2021-11-18 DIAGNOSIS — D692 Other nonthrombocytopenic purpura: Secondary | ICD-10-CM | POA: Diagnosis not present

## 2021-11-18 DIAGNOSIS — Z299 Encounter for prophylactic measures, unspecified: Secondary | ICD-10-CM | POA: Diagnosis not present

## 2021-11-18 DIAGNOSIS — F0390 Unspecified dementia without behavioral disturbance: Secondary | ICD-10-CM | POA: Diagnosis not present

## 2021-12-28 ENCOUNTER — Ambulatory Visit: Payer: Medicare PPO | Admitting: Diagnostic Neuroimaging

## 2021-12-28 ENCOUNTER — Encounter: Payer: Self-pay | Admitting: Diagnostic Neuroimaging

## 2021-12-28 VITALS — BP 132/64 | HR 51 | Ht 65.0 in | Wt 161.0 lb

## 2021-12-28 DIAGNOSIS — F03A0 Unspecified dementia, mild, without behavioral disturbance, psychotic disturbance, mood disturbance, and anxiety: Secondary | ICD-10-CM | POA: Diagnosis not present

## 2021-12-28 MED ORDER — MEMANTINE HCL 10 MG PO TABS: 10.0000 mg | ORAL_TABLET | Freq: Two times a day (BID) | ORAL | 12 refills | Status: DC

## 2021-12-28 NOTE — Progress Notes (Signed)
? ?[facility] ? ?PATIENT: Dawn Spence ?DOB: 04-28-1941 ? ?REFERRING CLINICIAN: Ignatius Specking, MD ?HISTORY FROM: patient, son, daughter in law ?REASON FOR VISIT: new consult ? ? ?HISTORICAL ? ?CHIEF COMPLAINT:  ?Chief Complaint  ?Patient presents with  ? New Patient (Initial Visit)  ?  Rm 7 with son and daughter-in-law. Reports decline in memory over the last 2-3 years but worse over the last year. At night memory seems to be worse.   ? ? ?HISTORY OF PRESENT ILLNESS:  ? ?81 year old female here for evaluation of cognitive memory decline.  Symptoms of been going on for at least 1 to 2 years.  Having short-term memory loss, mood swings, disorientation, progressively worsening over time.  Patient was having harder time keeping up with her medications and finances.  She is writing duplicate checks.  She was not feeling her medications.  About 1 year ago patient's family got involved to help her out.  Patient still lives alone.  She is able to maintain all her own personal ADLs hygiene, bathing, feeding issues.  No longer driving.  Having harder time functioning outside of the home independently.  ? ?Patient graduated from high school.  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 ? ? ?REVIEW OF SYSTEMS: Full 14 system review of systems performed and negative with exception of: as per hpi.  ? ?ALLERGIES: ?Allergies  ?Allergen Reactions  ? Sulfa Antibiotics   ?  Unknown reaction  ? ? ?HOME MEDICATIONS: ?Outpatient Medications Prior to Visit  ?Medication Sig Dispense Refill  ? amLODipine (NORVASC) 10 MG tablet Take 10 mg by mouth daily.    ? Apoaequorin (PREVAGEN) 10 MG CAPS Take 10 mg by mouth daily.    ? candesartan-hydrochlorothiazide (ATACAND HCT) 32-12.5 MG tablet Take 1 tablet by mouth daily.    ? Cyanocobalamin (VITAMIN B 12 PO) Take by mouth.    ? metoprolol tartrate (LOPRESSOR) 25 MG tablet Take 25-50 mg by mouth  See admin instructions. Take 50 mg in the evening and 25 mg in the evening    ? rosuvastatin (CRESTOR) 20 MG tablet Take 1 tablet (20 mg total) by mouth daily. 90 tablet 3  ? venlafaxine XR (EFFEXOR-XR) 75 MG 24 hr capsule Take 75 mg by mouth daily with breakfast.    ? acetaminophen (TYLENOL) 500 MG tablet Take 500 mg by mouth every 6 (six) hours as needed for moderate pain.    ? ?Facility-Administered Medications Prior to Visit  ?Medication Dose Route Frequency Provider Last Rate Last Admin  ? nitroGLYCERIN (NITRODUR - Dosed in mg/24 hr) patch 0.2 mg  0.2 mg Transdermal Daily Nada Libman, MD      ? ? ?PAST MEDICAL HISTORY: ?Past Medical History:  ?Diagnosis Date  ? Arterial embolism (HCC)   ? Blue toes   ? Great toe pain, left   ? Pain wakes her up at night.  ? Hypertension   ? Microembolization (HCC)   ? ? ?PAST SURGICAL HISTORY: ?Past Surgical History:  ?Procedure Laterality Date  ? ABDOMINAL AORTOGRAM W/LOWER EXTREMITY Bilateral 01/19/2021  ? Procedure: ABDOMINAL AORTOGRAM W/LOWER EXTREMITY;  Surgeon: Nada Libman, MD;  Location: [facility];  Service: Cardiovascular;  Laterality: Bilateral;  ? ? ?FAMILY HISTORY: ?Family History  ?Problem Relation Age of Onset  ? CAD Brother   ? ? ?SOCIAL HISTORY: ?Social History  ? ?Socioeconomic History  ? Marital  status: Widowed  ?  Spouse name: Not on file  ? Number of children: 1  ? Years of education: Not on file  ? Highest education level: Not on file  ?Occupational History  ? Not on file  ?Tobacco Use  ? Smoking status: Never  ? Smokeless tobacco: Never  ?Vaping Use  ? Vaping Use: Never used  ?Substance and Sexual Activity  ? Alcohol use: Not Currently  ? Drug use: Never  ? Sexual activity: Not on file  ?Other Topics Concern  ? Not on file  ?Social History Narrative  ? left handed   ? Caffeine- 1 cup per day   ? Lives at home alone  ? ?Social Determinants of Health  ? ?Financial Resource Strain: Not on file  ?Food Insecurity: Not on file  ?Transportation  Needs: Not on file  ?Physical Activity: Not on file  ?Stress: Not on file  ?Social Connections: Not on file  ?Intimate Partner Violence: Not on file  ? ? ? ?PHYSICAL EXAM ? ?GENERAL EXAM/CONSTITUTIONAL: ?Vitals:  ?Vitals:  ? 12/28/21 0954  ?BP: 132/64  ?Pulse: (!) 51  ?SpO2: 98%  ?Weight: 161 lb (73 kg)  ?Height: 5\' 5"  (1.651 m)  ? ?Body mass index is 26.79 kg/m?. ?Wt Readings from Last 3 Encounters:  ?12/28/21 161 lb (73 kg)  ?05/10/21 162 lb 6.4 oz (73.7 kg)  ?02/08/21 160 lb (72.6 kg)  ? ?Patient is in no distress; well developed, nourished and groomed; neck is supple ? ?CARDIOVASCULAR: ?Examination of carotid arteries is normal; no carotid bruits ?Regular rate and rhythm, no murmurs ?Examination of peripheral vascular system by observation and palpation is normal ? ?EYES: ?Ophthalmoscopic exam of optic discs and posterior segments is normal; no papilledema or hemorrhages ?No results found. ? ?MUSCULOSKELETAL: ?Gait, strength, tone, movements noted in Neurologic exam below ? ?NEUROLOGIC: ?MENTAL STATUS:  ? ?  12/28/2021  ? 10:05 AM  ?MMSE - Mini Mental State Exam  ?Orientation to time 5  ?Orientation to Place 3  ?Registration 3  ?Attention/ Calculation 3  ?Recall 0  ?Language- name 2 objects 2  ?Language- repeat 0  ?Language- follow 3 step command 3  ?Language- read & follow direction 1  ?Write a sentence 1  ?Copy design 0  ?Total score 21  ? ?awake, alert, oriented to person, place and time ?recent and remote memory intact ?normal attention and concentration ?language fluent, comprehension intact, naming intact ?fund of knowledge appropriate ? ?CRANIAL NERVE:  ?2nd - no papilledema on fundoscopic exam ?2nd, 3rd, 4th, 6th - pupils equal and reactive to light, visual fields full to confrontation, extraocular muscles intact, no nystagmus ?5th - facial sensation symmetric ?7th - facial strength symmetric ?8th - hearing intact ?9th - palate elevates symmetrically, uvula midline ?11th - shoulder shrug symmetric ?12th -  tongue protrusion midline ? ?MOTOR:  ?normal bulk and tone, full strength in the BUE, BLE ? ?SENSORY:  ?normal and symmetric to light touch, temperature, vibration ? ?COORDINATION:  ?finger-nose-finger, fine finger movements normal ? ?REFLEXES:  ?deep tendon reflexes TRACE and symmetric ? ?GAIT/STATION:  ?narrow based gait ? ? ? ? ?DIAGNOSTIC DATA (LABS, IMAGING, TESTING) ?- I reviewed patient records, labs, notes, testing and imaging myself where available. ? ?Lab Results  ?Component Value Date  ? HGB 13.6 01/19/2021  ? HCT 40.0 01/19/2021  ? ?   ?Component Value Date/Time  ? NA 139 01/19/2021 1125  ? K 3.5 01/19/2021 1125  ? CL 100 01/19/2021 1125  ? GLUCOSE 111 (H)  01/19/2021 1125  ? BUN 19 01/19/2021 1125  ? CREATININE 1.30 (H) 01/19/2021 1125  ? PROT 6.6 04/29/2021 0859  ? ALBUMIN 5.0 (H) 04/29/2021 0859  ? AST 22 04/29/2021 0859  ? ALT 19 04/29/2021 0859  ? ALKPHOS 97 04/29/2021 0859  ? BILITOT 0.5 04/29/2021 0859  ? ?Lab Results  ?Component Value Date  ? CHOL 141 04/29/2021  ? HDL 47 04/29/2021  ? LDLCALC 57 04/29/2021  ? TRIG 231 (H) 04/29/2021  ? CHOLHDL 3.0 04/29/2021  ? ?No results found for: HGBA1C ?No results found for: VITAMINB12 ?No results found for: TSH ? ? ? ? ?ASSESSMENT AND PLAN ? ?81 y.o. year old female here with: ? ? ?Dx: ? ?1. Mild dementia without behavioral disturbance, psychotic disturbance, mood disturbance, or anxiety, unspecified dementia type (HCC)   ? ? ? ?PLAN: ? 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 ?Orders Placed This Encounter  ?Procedures  ? MR BRAIN WO CONTRAST  ? ?Meds ordered this encounter  ?Medications  ? memantine (NAMENDA) 10 MG

## 2021-12-28 NOTE — Patient Instructions (Signed)
?  MEMORY LOSS (suspect mild dementia) ?- start memantine 10mg  at bedtime; increase to twice a day after 1-2 weeks ?- safety / supervision issues reviewed ?- daily physical activity / exercise (at least 15-30 minutes) ?- eat more plants / vegetables ?- increase social activities, brain stimulation, games, puzzles, hobbies, crafts, arts, music ?- aim for at least 7-8 hours sleep per night (or more) ?- avoid smoking and alcohol ?- caregiver resources provided ?- cannot manage medications, finances, or driving ?

## 2021-12-29 ENCOUNTER — Telehealth: Payer: Self-pay | Admitting: Diagnostic Neuroimaging

## 2021-12-29 NOTE — Telephone Encounter (Signed)
[employer] Berkley Harvey: [id number] exp. 12/29/21-6/2-23 sent to GI ?

## 2021-12-31 ENCOUNTER — Ambulatory Visit
Admission: RE | Admit: 2021-12-31 | Discharge: 2021-12-31 | Disposition: A | Discharge status: Home / Self Care | Payer: Medicare PPO | Source: Ambulatory Visit | Attending: Diagnostic Neuroimaging | Admitting: Diagnostic Neuroimaging

## 2021-12-31 DIAGNOSIS — F03A0 Unspecified dementia, mild, without behavioral disturbance, psychotic disturbance, mood disturbance, and anxiety: Secondary | ICD-10-CM

## 2022-01-06 ENCOUNTER — Encounter: Payer: Self-pay | Admitting: *Deleted

## 2022-01-12 ENCOUNTER — Other Ambulatory Visit: Payer: Self-pay | Admitting: Cardiovascular Disease

## 2022-02-23 ENCOUNTER — Other Ambulatory Visit: Payer: Self-pay | Admitting: Cardiovascular Disease

## 2022-02-24 ENCOUNTER — Other Ambulatory Visit: Payer: Self-pay

## 2022-02-24 MED ORDER — ROSUVASTATIN CALCIUM 20 MG PO TABS: 20.0000 mg | ORAL_TABLET | Freq: Every day | ORAL | 0 refills | Status: DC

## 2022-05-05 DIAGNOSIS — Z1339 Encounter for screening examination for other mental health and behavioral disorders: Secondary | ICD-10-CM | POA: Diagnosis not present

## 2022-05-05 DIAGNOSIS — I10 Essential (primary) hypertension: Secondary | ICD-10-CM | POA: Diagnosis not present

## 2022-05-05 DIAGNOSIS — R5383 Other fatigue: Secondary | ICD-10-CM | POA: Diagnosis not present

## 2022-05-05 DIAGNOSIS — Z0000 Encounter for general adult medical examination without abnormal findings: Secondary | ICD-10-CM | POA: Diagnosis not present

## 2022-05-05 DIAGNOSIS — Z1331 Encounter for screening for depression: Secondary | ICD-10-CM | POA: Diagnosis not present

## 2022-05-05 DIAGNOSIS — Z299 Encounter for prophylactic measures, unspecified: Secondary | ICD-10-CM | POA: Diagnosis not present

## 2022-05-05 DIAGNOSIS — Z6828 Body mass index (BMI) 28.0-28.9, adult: Secondary | ICD-10-CM | POA: Diagnosis not present

## 2022-05-05 DIAGNOSIS — Z7189 Other specified counseling: Secondary | ICD-10-CM | POA: Diagnosis not present

## 2022-05-05 DIAGNOSIS — E7800 Pure hypercholesterolemia, unspecified: Secondary | ICD-10-CM | POA: Diagnosis not present

## 2022-05-06 DIAGNOSIS — R5383 Other fatigue: Secondary | ICD-10-CM | POA: Diagnosis not present

## 2022-05-06 DIAGNOSIS — Z79899 Other long term (current) drug therapy: Secondary | ICD-10-CM | POA: Diagnosis not present

## 2022-05-06 DIAGNOSIS — F0390 Unspecified dementia without behavioral disturbance: Secondary | ICD-10-CM | POA: Diagnosis not present

## 2022-05-06 DIAGNOSIS — E7800 Pure hypercholesterolemia, unspecified: Secondary | ICD-10-CM | POA: Diagnosis not present

## 2022-05-22 ENCOUNTER — Other Ambulatory Visit: Payer: Self-pay | Admitting: Cardiovascular Disease

## 2022-06-03 ENCOUNTER — Other Ambulatory Visit: Payer: Self-pay | Admitting: Cardiovascular Disease

## 2022-08-10 DIAGNOSIS — H5203 Hypermetropia, bilateral: Secondary | ICD-10-CM | POA: Diagnosis not present

## 2023-02-09 ENCOUNTER — Other Ambulatory Visit: Payer: Self-pay | Admitting: *Deleted

## 2023-02-09 DIAGNOSIS — I75022 Atheroembolism of left lower extremity: Secondary | ICD-10-CM

## 2023-02-20 ENCOUNTER — Ambulatory Visit: Payer: Medicare PPO | Admitting: Physician Assistant

## 2023-02-20 ENCOUNTER — Ambulatory Visit (HOSPITAL_COMMUNITY)
Admission: RE | Admit: 2023-02-20 | Discharge: 2023-02-20 | Disposition: A | Discharge status: Home / Self Care | Payer: Medicare PPO | Source: Ambulatory Visit | Attending: Surgery | Admitting: Surgery

## 2023-02-20 VITALS — BP 152/74 | HR 55 | Temp 97.5°F | Resp 16 | Ht 64.0 in | Wt 172.5 lb

## 2023-02-20 DIAGNOSIS — I739 Peripheral vascular disease, unspecified: Secondary | ICD-10-CM

## 2023-02-20 DIAGNOSIS — I75022 Atheroembolism of left lower extremity: Secondary | ICD-10-CM

## 2023-02-20 LAB — VAS US ABI WITH/WO TBI
Left ABI: 0.59
Right ABI: 0.52

## 2023-02-20 NOTE — Progress Notes (Unsigned)
Office Note   History of Present Illness   Dawn Spence is a 82 y.o. (1941-06-01) female who presents for surveillance of PAD. They have a history of ***  The patient returns today for follow up. He/she denies any recent medical history changes. The patient also denies any claudication, rest pain, or tissue loss of the lower extremities.  Current Outpatient Medications  Medication Sig Dispense Refill   amLODipine (NORVASC) 10 MG tablet Take 10 mg by mouth daily.     Apoaequorin (PREVAGEN) 10 MG CAPS Take 10 mg by mouth daily.     candesartan-hydrochlorothiazide (ATACAND HCT) 32-12.5 MG tablet Take 1 tablet by mouth daily.     Cyanocobalamin (VITAMIN B 12 PO) Take by mouth.     memantine (NAMENDA) 10 MG tablet Take 1 tablet (10 mg total) by mouth 2 (two) times daily. 60 tablet 12   metoprolol tartrate (LOPRESSOR) 25 MG tablet Take 25-50 mg by mouth See admin instructions. Take 50 mg in the evening and 25 mg in the evening     rosuvastatin (CRESTOR) 20 MG tablet TAKE 1 TABLET BY MOUTH DAILY PATIENT NEEDS TO MAKE APPOINTMENT WITH PROVIDER FOR FURTHER REFILLS -1ST ATTEMPT 15 tablet 0   venlafaxine XR (EFFEXOR-XR) 75 MG 24 hr capsule Take 75 mg by mouth daily with breakfast.     Current Facility-Administered Medications  Medication Dose Route Frequency Provider Last Rate Last Admin   nitroGLYCERIN (NITRODUR - Dosed in mg/24 hr) patch 0.2 mg  0.2 mg Transdermal Daily Nada Libman, MD        ***REVIEW OF SYSTEMS (negative unless checked):   Cardiac:  []  Chest pain or chest pressure? []  Shortness of breath upon activity? []  Shortness of breath when lying flat? []  Irregular heart rhythm?  Vascular:  []  Pain in calf, thigh, or hip brought on by walking? []  Pain in feet at night that wakes you up from your sleep? []  Blood clot in your veins? []  Leg swelling?  Pulmonary:  []  Oxygen at home? []  Productive cough? []  Wheezing?  Neurologic:  []  Sudden weakness in arms or  legs? []  Sudden numbness in arms or legs? []  Sudden onset of difficult speaking or slurred speech? []  Temporary loss of vision in one eye? []  Problems with dizziness?  Gastrointestinal:  []  Blood in stool? []  Vomited blood?  Genitourinary:  []  Burning when urinating? []  Blood in urine?  Psychiatric:  []  Major depression  Hematologic:  []  Bleeding problems? []  Problems with blood clotting?  Dermatologic:  []  Rashes or ulcers?  Constitutional:  []  Fever or chills?  Ear/Nose/Throat:  []  Change in hearing? []  Nose bleeds? []  Sore throat?  Musculoskeletal:  []  Back pain? []  Joint pain? []  Muscle pain?   Physical Examination  *** Vitals:   02/20/23 1010  BP: (!) 152/74  Pulse: (!) 55  Resp: 16  Temp: (!) 97.5 F (36.4 C)  TempSrc: Temporal  SpO2: 96%  Weight: 172 lb 8 oz (78.2 kg)  Height: 5\' 4"  (1.626 m)   ***Body mass index is 29.61 kg/m.  General:  WDWN in NAD; vital signs documented above Gait: Not observed HENT: WNL, normocephalic Pulmonary: normal non-labored breathing , without rales, rhonchi,  wheezing Cardiac: {Desc; regular/irreg:14544} HR, without murmurs {With/Without:20273} carotid bruit*** Abdomen: soft, NT, no masses Skin: {With/Without:20273} rashes Vascular Exam/Pulses: Palpable/nonpalpable femoral pulses, palpable/nonpalpable popliteal pulses, palpable/nonpalpable pedal pulses. Left DP/PT/Peroneal doppler signals. Right DP/PT/Peroneal doppler signals Extremities: {With/Without:20273} ischemic changes, {With/Without:20273} gangrene , {With/Without:20273} cellulitis; {With/Without:20273} open  wounds;  Musculoskeletal: no muscle wasting or atrophy  Neurologic: A&O X 3;  No focal weakness or paresthesias are detected Psychiatric:  The pt has {Desc; normal/abnormal:11317::"Normal"} affect.  Non-Invasive Vascular imaging   ABI (02/20/2023) R:  ABI: 0.52 (0.70),  PT: mono DP: mono TBI:  0.25 L:  ABI: 0.59 (0.40),  PT: mono DP:  mono TBI: 0.22   Medical Decision Making   Dawn Spence is a 82 y.o. female who presents for surveillance of PAD  Based on the patient's vascular studies, their ABIs are essentially unchanged since last visit. *** Arterial duplex *** The patient denies any claudication, rest pain, or tissue loss. They have palpable/nonpalpable pedal pulses with *** doppler signals Follow up in 1 year with ABIs No discoloration   Loel Dubonnet PA-C [facility] Office: [phone]  Clinic MD: ***

## 2023-02-27 ENCOUNTER — Other Ambulatory Visit: Payer: Self-pay

## 2023-02-27 DIAGNOSIS — I739 Peripheral vascular disease, unspecified: Secondary | ICD-10-CM

## 2023-05-18 DIAGNOSIS — I10 Essential (primary) hypertension: Secondary | ICD-10-CM | POA: Diagnosis not present

## 2023-05-18 DIAGNOSIS — Z79899 Other long term (current) drug therapy: Secondary | ICD-10-CM | POA: Diagnosis not present

## 2023-05-18 DIAGNOSIS — R5383 Other fatigue: Secondary | ICD-10-CM | POA: Diagnosis not present

## 2023-05-18 DIAGNOSIS — E7800 Pure hypercholesterolemia, unspecified: Secondary | ICD-10-CM | POA: Diagnosis not present

## 2023-05-18 DIAGNOSIS — Z1339 Encounter for screening examination for other mental health and behavioral disorders: Secondary | ICD-10-CM | POA: Diagnosis not present

## 2023-05-18 DIAGNOSIS — Z0000 Encounter for general adult medical examination without abnormal findings: Secondary | ICD-10-CM | POA: Diagnosis not present

## 2023-05-18 DIAGNOSIS — Z7189 Other specified counseling: Secondary | ICD-10-CM | POA: Diagnosis not present

## 2023-05-18 DIAGNOSIS — Z1331 Encounter for screening for depression: Secondary | ICD-10-CM | POA: Diagnosis not present

## 2023-05-18 DIAGNOSIS — Z299 Encounter for prophylactic measures, unspecified: Secondary | ICD-10-CM | POA: Diagnosis not present

## 2023-05-19 DIAGNOSIS — Z79899 Other long term (current) drug therapy: Secondary | ICD-10-CM | POA: Diagnosis not present

## 2023-05-19 DIAGNOSIS — E7800 Pure hypercholesterolemia, unspecified: Secondary | ICD-10-CM | POA: Diagnosis not present

## 2023-05-19 DIAGNOSIS — R5383 Other fatigue: Secondary | ICD-10-CM | POA: Diagnosis not present

## 2023-07-03 DIAGNOSIS — Z6828 Body mass index (BMI) 28.0-28.9, adult: Secondary | ICD-10-CM | POA: Diagnosis not present

## 2023-07-03 DIAGNOSIS — S61412A Laceration without foreign body of left hand, initial encounter: Secondary | ICD-10-CM | POA: Diagnosis not present

## 2023-07-03 DIAGNOSIS — R030 Elevated blood-pressure reading, without diagnosis of hypertension: Secondary | ICD-10-CM | POA: Diagnosis not present

## 2023-07-03 DIAGNOSIS — E663 Overweight: Secondary | ICD-10-CM | POA: Diagnosis not present

## 2023-07-25 ENCOUNTER — Ambulatory Visit: Payer: Medicare PPO | Admitting: Diagnostic Neuroimaging

## 2023-08-01 ENCOUNTER — Ambulatory Visit: Payer: Medicare PPO | Admitting: Diagnostic Neuroimaging

## 2023-08-01 DIAGNOSIS — J069 Acute upper respiratory infection, unspecified: Secondary | ICD-10-CM | POA: Diagnosis not present

## 2023-08-01 DIAGNOSIS — I10 Essential (primary) hypertension: Secondary | ICD-10-CM | POA: Diagnosis not present

## 2023-08-01 DIAGNOSIS — Z299 Encounter for prophylactic measures, unspecified: Secondary | ICD-10-CM | POA: Diagnosis not present

## 2023-08-01 DIAGNOSIS — R062 Wheezing: Secondary | ICD-10-CM | POA: Diagnosis not present

## 2023-08-10 DIAGNOSIS — F0390 Unspecified dementia without behavioral disturbance: Secondary | ICD-10-CM | POA: Diagnosis not present

## 2023-08-10 DIAGNOSIS — R55 Syncope and collapse: Secondary | ICD-10-CM | POA: Diagnosis not present

## 2023-08-10 DIAGNOSIS — F03A0 Unspecified dementia, mild, without behavioral disturbance, psychotic disturbance, mood disturbance, and anxiety: Secondary | ICD-10-CM | POA: Diagnosis not present

## 2023-08-10 DIAGNOSIS — N179 Acute kidney failure, unspecified: Secondary | ICD-10-CM | POA: Diagnosis not present

## 2023-08-10 DIAGNOSIS — I10 Essential (primary) hypertension: Secondary | ICD-10-CM | POA: Diagnosis not present

## 2023-08-10 DIAGNOSIS — J121 Respiratory syncytial virus pneumonia: Secondary | ICD-10-CM | POA: Diagnosis not present

## 2023-08-10 DIAGNOSIS — B974 Respiratory syncytial virus as the cause of diseases classified elsewhere: Secondary | ICD-10-CM | POA: Diagnosis not present

## 2023-08-10 DIAGNOSIS — R531 Weakness: Secondary | ICD-10-CM | POA: Diagnosis not present

## 2023-08-10 DIAGNOSIS — R0902 Hypoxemia: Secondary | ICD-10-CM | POA: Diagnosis not present

## 2023-08-10 DIAGNOSIS — J209 Acute bronchitis, unspecified: Secondary | ICD-10-CM | POA: Diagnosis not present

## 2023-08-10 DIAGNOSIS — I959 Hypotension, unspecified: Secondary | ICD-10-CM | POA: Diagnosis not present

## 2023-08-10 DIAGNOSIS — D62 Acute posthemorrhagic anemia: Secondary | ICD-10-CM | POA: Diagnosis not present

## 2023-08-10 DIAGNOSIS — R000 Tachycardia, unspecified: Secondary | ICD-10-CM | POA: Diagnosis not present

## 2023-08-10 DIAGNOSIS — R062 Wheezing: Secondary | ICD-10-CM | POA: Diagnosis not present

## 2023-08-10 DIAGNOSIS — F03918 Unspecified dementia, unspecified severity, with other behavioral disturbance: Secondary | ICD-10-CM | POA: Diagnosis not present

## 2023-08-10 DIAGNOSIS — R6520 Severe sepsis without septic shock: Secondary | ICD-10-CM | POA: Diagnosis not present

## 2023-08-10 DIAGNOSIS — R6883 Chills (without fever): Secondary | ICD-10-CM | POA: Diagnosis not present

## 2023-08-10 DIAGNOSIS — K921 Melena: Secondary | ICD-10-CM | POA: Diagnosis not present

## 2023-08-10 DIAGNOSIS — A419 Sepsis, unspecified organism: Secondary | ICD-10-CM | POA: Diagnosis not present

## 2023-08-10 DIAGNOSIS — E119 Type 2 diabetes mellitus without complications: Secondary | ICD-10-CM | POA: Diagnosis not present

## 2023-08-10 DIAGNOSIS — Z299 Encounter for prophylactic measures, unspecified: Secondary | ICD-10-CM | POA: Diagnosis not present

## 2023-08-10 DIAGNOSIS — Z792 Long term (current) use of antibiotics: Secondary | ICD-10-CM | POA: Diagnosis not present

## 2023-08-10 DIAGNOSIS — R059 Cough, unspecified: Secondary | ICD-10-CM | POA: Diagnosis not present

## 2023-08-10 DIAGNOSIS — Z79899 Other long term (current) drug therapy: Secondary | ICD-10-CM | POA: Diagnosis not present

## 2023-08-10 DIAGNOSIS — R0689 Other abnormalities of breathing: Secondary | ICD-10-CM | POA: Diagnosis not present

## 2023-08-10 DIAGNOSIS — F03A18 Unspecified dementia, mild, with other behavioral disturbance: Secondary | ICD-10-CM | POA: Diagnosis not present

## 2023-08-10 DIAGNOSIS — I700 Atherosclerosis of aorta: Secondary | ICD-10-CM | POA: Diagnosis not present

## 2023-08-10 DIAGNOSIS — A4189 Other specified sepsis: Secondary | ICD-10-CM | POA: Diagnosis not present

## 2023-08-28 DIAGNOSIS — I10 Essential (primary) hypertension: Secondary | ICD-10-CM | POA: Diagnosis not present

## 2023-08-28 DIAGNOSIS — N1831 Chronic kidney disease, stage 3a: Secondary | ICD-10-CM | POA: Diagnosis not present

## 2023-08-28 DIAGNOSIS — R845 Abnormal microbiological findings in specimens from respiratory organs and thorax: Secondary | ICD-10-CM | POA: Diagnosis not present

## 2023-08-28 DIAGNOSIS — D649 Anemia, unspecified: Secondary | ICD-10-CM | POA: Diagnosis not present

## 2023-08-28 DIAGNOSIS — Z09 Encounter for follow-up examination after completed treatment for conditions other than malignant neoplasm: Secondary | ICD-10-CM | POA: Diagnosis not present

## 2023-08-29 DIAGNOSIS — N1831 Chronic kidney disease, stage 3a: Secondary | ICD-10-CM | POA: Diagnosis not present

## 2023-08-29 DIAGNOSIS — D649 Anemia, unspecified: Secondary | ICD-10-CM | POA: Diagnosis not present

## 2023-09-15 DIAGNOSIS — J121 Respiratory syncytial virus pneumonia: Secondary | ICD-10-CM | POA: Diagnosis not present

## 2023-10-03 DIAGNOSIS — R0981 Nasal congestion: Secondary | ICD-10-CM | POA: Diagnosis not present

## 2023-10-03 DIAGNOSIS — J069 Acute upper respiratory infection, unspecified: Secondary | ICD-10-CM | POA: Diagnosis not present

## 2023-10-03 DIAGNOSIS — Z299 Encounter for prophylactic measures, unspecified: Secondary | ICD-10-CM | POA: Diagnosis not present

## 2023-10-16 DIAGNOSIS — J121 Respiratory syncytial virus pneumonia: Secondary | ICD-10-CM | POA: Diagnosis not present

## 2023-11-13 DIAGNOSIS — J121 Respiratory syncytial virus pneumonia: Secondary | ICD-10-CM | POA: Diagnosis not present

## 2023-12-14 DIAGNOSIS — J121 Respiratory syncytial virus pneumonia: Secondary | ICD-10-CM | POA: Diagnosis not present

## 2024-01-11 DIAGNOSIS — I10 Essential (primary) hypertension: Secondary | ICD-10-CM | POA: Diagnosis not present

## 2024-01-11 DIAGNOSIS — Z1331 Encounter for screening for depression: Secondary | ICD-10-CM | POA: Diagnosis not present

## 2024-01-11 DIAGNOSIS — Z7189 Other specified counseling: Secondary | ICD-10-CM | POA: Diagnosis not present

## 2024-01-11 DIAGNOSIS — Z0000 Encounter for general adult medical examination without abnormal findings: Secondary | ICD-10-CM | POA: Diagnosis not present

## 2024-01-11 DIAGNOSIS — I739 Peripheral vascular disease, unspecified: Secondary | ICD-10-CM | POA: Diagnosis not present

## 2024-01-11 DIAGNOSIS — Z1339 Encounter for screening examination for other mental health and behavioral disorders: Secondary | ICD-10-CM | POA: Diagnosis not present

## 2024-01-11 DIAGNOSIS — R8290 Unspecified abnormal findings in urine: Secondary | ICD-10-CM | POA: Diagnosis not present

## 2024-01-11 DIAGNOSIS — F322 Major depressive disorder, single episode, severe without psychotic features: Secondary | ICD-10-CM | POA: Diagnosis not present

## 2024-01-11 DIAGNOSIS — Z299 Encounter for prophylactic measures, unspecified: Secondary | ICD-10-CM | POA: Diagnosis not present

## 2024-01-13 DIAGNOSIS — J121 Respiratory syncytial virus pneumonia: Secondary | ICD-10-CM | POA: Diagnosis not present

## 2024-01-16 DIAGNOSIS — Z299 Encounter for prophylactic measures, unspecified: Secondary | ICD-10-CM | POA: Diagnosis not present

## 2024-01-16 DIAGNOSIS — I10 Essential (primary) hypertension: Secondary | ICD-10-CM | POA: Diagnosis not present

## 2024-01-16 DIAGNOSIS — H6123 Impacted cerumen, bilateral: Secondary | ICD-10-CM | POA: Diagnosis not present

## 2024-02-13 DIAGNOSIS — J121 Respiratory syncytial virus pneumonia: Secondary | ICD-10-CM | POA: Diagnosis not present

## 2024-03-06 ENCOUNTER — Telehealth: Payer: Self-pay | Admitting: Diagnostic Neuroimaging

## 2024-03-06 NOTE — Telephone Encounter (Signed)
 LVM and sent text msg informing pt of need to reschedule 03/12/24 appt - MD out  If patient calls back you can offer to change 03/12/24 appointment to a video visit

## 2024-03-12 ENCOUNTER — Ambulatory Visit: Admitting: Diagnostic Neuroimaging

## 2024-03-13 ENCOUNTER — Telehealth: Payer: Self-pay | Admitting: Diagnostic Neuroimaging

## 2024-03-13 NOTE — Telephone Encounter (Signed)
 Error

## 2024-03-13 NOTE — Telephone Encounter (Signed)
 LVM offering sooner appt with Dr. Margaret for next week

## 2024-03-14 DIAGNOSIS — J121 Respiratory syncytial virus pneumonia: Secondary | ICD-10-CM | POA: Diagnosis not present

## 2024-03-21 ENCOUNTER — Encounter: Payer: Self-pay | Admitting: Diagnostic Neuroimaging

## 2024-03-21 ENCOUNTER — Ambulatory Visit: Admitting: Diagnostic Neuroimaging

## 2024-03-21 VITALS — BP 189/88 | HR 73 | Ht 64.0 in | Wt 170.0 lb

## 2024-03-21 DIAGNOSIS — F03B3 Unspecified dementia, moderate, with mood disturbance: Secondary | ICD-10-CM

## 2024-03-21 DIAGNOSIS — R413 Other amnesia: Secondary | ICD-10-CM

## 2024-03-21 NOTE — Progress Notes (Signed)
 [facility]  PATIENT: Dawn Spence DOB: [birth year]  REFERRING CLINICIAN: Rosamond Leta NOVAK, MD HISTORY FROM: patient, son, daughter in law REASON FOR VISIT: follow up   HISTORICAL  CHIEF COMPLAINT:  Chief Complaint  Patient presents with   Follow-up    Room 7 pt is here with family   Pt is follow up  the family stated that the memory  has gone worse on the year, she has been staying with family since fall. Discuss  option     HISTORY OF PRESENT ILLNESS:   UPDATE (03/21/24, VRP): Since last visit, progression of symptoms. More diff with food, falls, taking care of herself. Moved in with Nov 2024. Then had admission Dec 2024 for syncope and RSV. Some delusions, hallucinations, wandering also. Diff with eating and swallowing.   PRIOR HPI (12/28/21, VRP): 83 year old female here for evaluation of cognitive memory decline.  Symptoms of been going on for at least 1 to 2 years.  Having short-term memory loss, mood swings, disorientation, progressively worsening over time.  Patient was having harder time keeping up with her medications and finances.  She is writing duplicate checks.  She was not feeling her medications.  About 1 year ago patient's family got involved to help her out.  Patient still lives alone.  She is able to maintain all her own personal ADLs hygiene, bathing, feeding issues.  No longer driving.  Having harder time functioning outside of the home independently.   Patient graduated from high school.  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   REVIEW OF SYSTEMS: Full 14 system review of systems performed and negative with exception of: as per hpi.   ALLERGIES: Allergies  Allergen Reactions   Sulfa Antibiotics     Unknown reaction    HOME MEDICATIONS: Outpatient Medications Prior to Visit  Medication Sig Dispense Refill   amLODipine (NORVASC) 10 MG tablet Take 10 mg by  mouth daily.     metoprolol tartrate (LOPRESSOR) 25 MG tablet Take 25-50 mg by mouth See admin instructions. Take 50 mg in the evening and 25 mg in the evening     rosuvastatin  (CRESTOR ) 20 MG tablet TAKE 1 TABLET BY MOUTH DAILY PATIENT NEEDS TO MAKE APPOINTMENT WITH PROVIDER FOR FURTHER REFILLS -1ST ATTEMPT 15 tablet 0   venlafaxine XR (EFFEXOR-XR) 75 MG 24 hr capsule Take 75 mg by mouth daily with breakfast.     Apoaequorin (PREVAGEN) 10 MG CAPS Take 10 mg by mouth daily. (Patient not taking: Reported on 02/20/2023)     aspirin  EC 81 MG tablet Take 81 mg by mouth daily. Swallow whole. (Patient not taking: Reported on 03/21/2024)     candesartan-hydrochlorothiazide (ATACAND HCT) 32-12.5 MG tablet Take 1 tablet by mouth daily.     Cyanocobalamin  (VITAMIN B 12 PO) Take by mouth.     memantine  (NAMENDA ) 10 MG tablet Take 1 tablet (10 mg total) by mouth 2 (two) times daily. (Patient not taking: Reported on 02/20/2023) 60 tablet 12   Facility-Administered Medications Prior to Visit  Medication Dose Route Frequency Provider Last Rate Last Admin   nitroGLYCERIN  (NITRODUR - Dosed in mg/24 hr) patch 0.2 mg  0.2 mg Transdermal Daily Serene Gaile LELON, MD        PAST MEDICAL HISTORY: Past Medical History:  Diagnosis Date   Arterial embolism (HCC)    Blue toes    Great toe  pain, left    Pain wakes her up at night.   Hypertension    Microembolization (HCC)     PAST SURGICAL HISTORY: Past Surgical History:  Procedure Laterality Date   ABDOMINAL AORTOGRAM W/LOWER EXTREMITY Bilateral 01/19/2021   Procedure: ABDOMINAL AORTOGRAM W/LOWER EXTREMITY;  Surgeon: Serene Gaile ORN, MD;  Location: [facility];  Service: Cardiovascular;  Laterality: Bilateral;    FAMILY HISTORY: Family History  Problem Relation Age of Onset   CAD Brother     SOCIAL HISTORY: Social History   Socioeconomic History   Marital status: Widowed    Spouse name: Not on file   Number of children: 1   Years of education:  Not on file   Highest education level: Not on file  Occupational History   Not on file  Tobacco Use   Smoking status: Never   Smokeless tobacco: Never  Vaping Use   Vaping status: Never Used  Substance and Sexual Activity   Alcohol  use: Not Currently   Drug use: Never   Sexual activity: Not on file  Other Topics Concern   Not on file  Social History Narrative   left handed    Caffeine- 1 cup per day    Lives at home alone   Social Drivers of Health   Financial Resource Strain: Low Risk  (08/12/2023)   Received [facility]   Overall Financial Resource Strain (CARDIA)    Difficulty of Paying Living Expenses: Not hard at all  Food Insecurity: No Food Insecurity (08/12/2023)   Received from [facility]   Hunger Vital Sign    Within the past 12 months, you worried that your food would run out before you got the money to buy more.: Never true    Within the past 12 months, the food you bought just didn't last and you didn't have money to get more.: Never true  Transportation Needs: No Transportation Needs (08/12/2023)   Received from [facility]   PRAPARE - Transportation    Lack of Transportation (Medical): No    Lack of Transportation (Non-Medical): No  Physical Activity: Not on file  Stress: Not on file  Social Connections: Not on file  Intimate Partner Violence: Not At Risk (08/12/2023)   Received from [facility]   Humiliation, Afraid, Rape, and Kick questionnaire    Within the last year, have you been afraid of your partner or ex-partner?: No    Within the last year, have you been humiliated or emotionally abused in other ways by your partner or ex-partner?: No    Within the last year, have you been kicked, hit, slapped, or otherwise physically hurt by your partner or ex-partner?: No    Within the last year, have you been raped or forced to have any kind of sexual activity by your partner or ex-partner?: No     PHYSICAL EXAM  GENERAL  EXAM/CONSTITUTIONAL: Vitals:  Vitals:   03/21/24 1455  BP: (!) 189/88  Pulse: 73  Weight: 170 lb (77.1 kg)  Height: 5' 4 (1.626 m)   Body mass index is 29.18 kg/m. Wt Readings from Last 3 Encounters:  03/21/24 170 lb (77.1 kg)  02/20/23 172 lb 8 oz (78.2 kg)  12/28/21 161 lb (73 kg)   Patient is in no distress; well developed, nourished and groomed; neck is supple  CARDIOVASCULAR: Examination of carotid arteries is normal; no carotid bruits Regular rate and rhythm, no murmurs Examination of peripheral vascular system by observation  and palpation is normal  EYES: Ophthalmoscopic exam of optic discs and posterior segments is normal; no papilledema or hemorrhages No results found.  MUSCULOSKELETAL: Gait, strength, tone, movements noted in Neurologic exam below  NEUROLOGIC: MENTAL STATUS:     03/21/2024    3:13 PM 12/28/2021   10:05 AM  MMSE - Mini Mental State Exam  Orientation to time 1 5  Orientation to Place 3 3  Registration 3 3  Attention/ Calculation 1 3  Recall 1 0  Language- name 2 objects 2 2  Language- repeat 1 0  Language- follow 3 step command 3 3  Language- read & follow direction 1 1  Write a sentence 1 1  Copy design 0 0  Total score 17 21   awake, alert, oriented to person DECR MEMORY DECR attention and concentration DECR FLUENCY  CRANIAL NERVE:  2nd, 3rd, 4th, 6th - pupils equal and reactive to light, visual fields full to confrontation, extraocular muscles intact, no nystagmus 5th - facial sensation symmetric 7th - facial strength symmetric 8th - hearing intact 9th - palate elevates symmetrically, uvula midline 11th - shoulder shrug symmetric 12th - tongue protrusion midline  MOTOR:  normal bulk and tone, full strength in the BUE, BLE  SENSORY:  normal and symmetric to light touch, temperature, vibration  COORDINATION:  finger-nose-finger, fine finger movements normal  REFLEXES:  deep tendon reflexes TRACE and  symmetric  GAIT/STATION:  narrow based gait     DIAGNOSTIC DATA (LABS, IMAGING, TESTING) - I reviewed patient records, labs, notes, testing and imaging myself where available.  Lab Results  Component Value Date   HGB 13.6 01/19/2021   HCT 40.0 01/19/2021      Component Value Date/Time   NA 139 01/19/2021 1125   K 3.5 01/19/2021 1125   CL 100 01/19/2021 1125   GLUCOSE 111 (H) 01/19/2021 1125   BUN 19 01/19/2021 1125   CREATININE 1.30 (H) 01/19/2021 1125   PROT 6.6 04/29/2021 0859   ALBUMIN 5.0 (H) 04/29/2021 0859   AST 22 04/29/2021 0859   ALT 19 04/29/2021 0859   ALKPHOS 97 04/29/2021 0859   BILITOT 0.5 04/29/2021 0859   Lab Results  Component Value Date   CHOL 141 04/29/2021   HDL 47 04/29/2021   LDLCALC 57 04/29/2021   TRIG 231 (H) 04/29/2021   CHOLHDL 3.0 04/29/2021   No results found for: HGBA1C No results found for: VITAMINB12 No results found for: TSH   12/31/21 MRI scan of the brain without contrast showing only age-appropriate mild changes of chronic small vessel disease.    ASSESSMENT AND PLAN  83 y.o. year old female here with:   Dx:  1. Moderate dementia with mood disturbance, unspecified dementia type (HCC)     PLAN:  MODERATE DEMENTIA WITH MOOD CHANGES --> (progressive, moderate cognitive decline, with changes in ADLs) - consider quetiapine or rexulti if mood / agitation significantly worsen - safety / supervision issues reviewed; consider home care aid for respite care - daily physical activity / exercise (at least 15-30 minutes) - eat more plants / vegetables - increase social activities, brain stimulation, games, puzzles, hobbies, crafts, arts, music - aim for at least 7-8 hours sleep per night (or more) - avoid smoking and alcohol  - caregiver resources provided ([email or web address]) - cannot manage medications, finances, living alone or driving  Return for return to PCP, pending if symptoms worsen or fail to improve.    EDUARD FABIENE HANLON, MD 03/21/2024, 4:01 PM Certified in Neurology,  Neurophysiology and Neuroimaging  [facility] [address] [place], [place] [address] [phone]

## 2024-03-21 NOTE — Patient Instructions (Signed)
  MODERATE DEMENTIA WITH MOOD CHANGES --> (progressive, moderate cognitive decline, with changes in ADLs) - continue memantine  10mg  at bedtime; increase to twice a day after 1-2 weeks - consider quetiapine or rexulti if mood / agitation significantly worsen - safety / supervision issues reviewed - daily physical activity / exercise (at least 15-30 minutes) - eat more plants / vegetables - increase social activities, brain stimulation, games, puzzles, hobbies, crafts, arts, music - aim for at least 7-8 hours sleep per night (or more) - avoid smoking and alcohol  - caregiver resources provided ([email or web address]) - cannot manage medications, finances, living alone or driving

## 2024-04-14 DIAGNOSIS — J121 Respiratory syncytial virus pneumonia: Secondary | ICD-10-CM | POA: Diagnosis not present

## 2024-04-30 ENCOUNTER — Ambulatory Visit: Admitting: Diagnostic Neuroimaging

## 2024-05-15 DIAGNOSIS — J121 Respiratory syncytial virus pneumonia: Secondary | ICD-10-CM | POA: Diagnosis not present

## 2024-05-23 DIAGNOSIS — Z79899 Other long term (current) drug therapy: Secondary | ICD-10-CM | POA: Diagnosis not present

## 2024-05-23 DIAGNOSIS — D649 Anemia, unspecified: Secondary | ICD-10-CM | POA: Diagnosis not present

## 2024-05-23 DIAGNOSIS — Z0000 Encounter for general adult medical examination without abnormal findings: Secondary | ICD-10-CM | POA: Diagnosis not present

## 2024-05-23 DIAGNOSIS — I10 Essential (primary) hypertension: Secondary | ICD-10-CM | POA: Diagnosis not present

## 2024-05-23 DIAGNOSIS — E7800 Pure hypercholesterolemia, unspecified: Secondary | ICD-10-CM | POA: Diagnosis not present

## 2024-05-23 DIAGNOSIS — Z299 Encounter for prophylactic measures, unspecified: Secondary | ICD-10-CM | POA: Diagnosis not present

## 2024-05-23 DIAGNOSIS — F411 Generalized anxiety disorder: Secondary | ICD-10-CM | POA: Diagnosis not present

## 2024-06-13 DIAGNOSIS — I10 Essential (primary) hypertension: Secondary | ICD-10-CM | POA: Diagnosis not present

## 2024-06-13 DIAGNOSIS — Z299 Encounter for prophylactic measures, unspecified: Secondary | ICD-10-CM | POA: Diagnosis not present

## 2024-06-14 DIAGNOSIS — J121 Respiratory syncytial virus pneumonia: Secondary | ICD-10-CM | POA: Diagnosis not present
